# Patient Record
Sex: Female | Born: 1970 | Hispanic: Yes | Marital: Married | Smoking: Never smoker
Health system: Southern US, Community
[De-identification: ages and names within clinical notes are randomized; demographics above are authoritative.]

## PROBLEM LIST (undated history)

## (undated) DIAGNOSIS — Z862 Personal history of diseases of the blood and blood-forming organs and certain disorders involving the immune mechanism: Secondary | ICD-10-CM

## (undated) HISTORY — PX: ABDOMINAL HYSTERECTOMY: SHX81

## (undated) HISTORY — DX: Personal history of diseases of the blood and blood-forming organs and certain disorders involving the immune mechanism: Z86.2

## (undated) HISTORY — PX: TUBAL LIGATION: SHX77

---

## 2016-09-26 ENCOUNTER — Ambulatory Visit (INDEPENDENT_AMBULATORY_CARE_PROVIDER_SITE_OTHER): Payer: Self-pay | Admitting: Emergency Medicine

## 2016-09-26 ENCOUNTER — Encounter: Payer: Self-pay | Admitting: Emergency Medicine

## 2016-09-26 VITALS — BP 121/78 | HR 74 | Temp 98.3°F | Resp 16 | Ht 63.0 in | Wt 129.6 lb

## 2016-09-26 DIAGNOSIS — G44229 Chronic tension-type headache, not intractable: Secondary | ICD-10-CM

## 2016-09-26 DIAGNOSIS — R1013 Epigastric pain: Secondary | ICD-10-CM

## 2016-09-26 DIAGNOSIS — R42 Dizziness and giddiness: Secondary | ICD-10-CM

## 2016-09-26 NOTE — Progress Notes (Signed)
Alexandra Barnes 46 y.o.   Chief Complaint  Patient presents with  . Dizziness    MONTHS  . Headache    HISTORY OF PRESENT ILLNESS: This is a 46 y.o. female complaining of chronic dizziness and headaches x years; last medical evaluation many years ago. HPI   Prior to Admission medications   Not on File    Allergies not on file  There are no active problems to display for this patient.   No past medical history on file.  No past surgical history on file.  Social History   Social History  . Marital status: Married    Spouse name: N/A  . Number of children: N/A  . Years of education: N/A   Occupational History  . Not on file.   Social History Main Topics  . Smoking status: Never Smoker  . Smokeless tobacco: Never Used  . Alcohol use No  . Drug use: No  . Sexual activity: Not on file   Other Topics Concern  . Not on file   Social History Narrative  . No narrative on file    No family history on file.   Review of Systems  Constitutional: Negative for chills, fever, malaise/fatigue and weight loss.       Hair loss  HENT: Negative.   Eyes: Negative.   Respiratory: Negative for cough and shortness of breath.   Cardiovascular: Negative for chest pain, palpitations and leg swelling.  Gastrointestinal: Negative for abdominal pain, diarrhea, nausea and vomiting.       Epigastric fullness, early satiety.  Genitourinary: Negative for dysuria and hematuria.       Loss of libido.  Musculoskeletal: Negative for joint pain.  Skin: Negative.  Negative for rash.  Neurological: Positive for dizziness and headaches.  Endo/Heme/Allergies: Negative.   Psychiatric/Behavioral: Negative for depression, substance abuse and suicidal ideas. The patient is not nervous/anxious and does not have insomnia.   All other systems reviewed and are negative.  Vitals:   09/26/16 0854  BP: 121/78  Pulse: 74  Resp: 16  Temp: 98.3 F (36.8 C)     Physical Exam    Constitutional: She is oriented to person, place, and time. She appears well-developed and well-nourished.  HENT:  Head: Normocephalic and atraumatic.  Right Ear: External ear normal.  Left Ear: External ear normal.  Nose: Nose normal.  Mouth/Throat: Oropharynx is clear and moist.  Eyes: Conjunctivae and EOM are normal. Pupils are equal, round, and reactive to light.  Neck: Normal range of motion. Neck supple. No JVD present.  Cardiovascular: Normal rate, regular rhythm, normal heart sounds and intact distal pulses.   Pulmonary/Chest: Effort normal and breath sounds normal.  Abdominal: Soft. Bowel sounds are normal. She exhibits no distension. There is no tenderness.  Musculoskeletal: Normal range of motion.  Lymphadenopathy:    She has no cervical adenopathy.  Neurological: She is alert and oriented to person, place, and time. No sensory deficit. She exhibits normal muscle tone. Coordination normal.  Skin: Skin is warm and dry. Capillary refill takes less than 2 seconds. No rash noted.  Psychiatric: She has a normal mood and affect. Her behavior is normal.  Vitals reviewed.    ASSESSMENT & PLAN: Dizziness Chronic and almost always orthostatic.  Chronic tension-type headache, not intractable Chronic and non-debilitating.  Alexandra Barnes was seen today for dizziness and headache.  Diagnoses and all orders for this visit:  Dizziness -     Ambulatory referral to Gynecology -     CBC  with Differential/Platelet -     Comprehensive metabolic panel -     Lipid panel -     Hemoglobin A1c -     ANA,IFA RA Diag Pnl w/rflx Tit/Patn -     TSH  Chronic tension-type headache, not intractable  Epigastric discomfort -     Ambulatory referral to Gastroenterology    Patient Instructions       IF you received an x-ray today, you will receive an invoice from [facility]. Please contact [facility] at [phone] with questions or concerns regarding your invoice.   IF  you received labwork today, you will receive an invoice from [facility]. Please contact [facility] at [phone] with questions or concerns regarding your invoice.   Our billing staff will not be able to assist you with questions regarding bills from these companies.  You will be contacted with the lab results as soon as they are available. The fastest way to get your results is to activate your My Chart account. Instructions are located on the last page of this paperwork. If you have not heard from us regarding the results in 2 weeks, please contact this office.    We recommend that you schedule a mammogram for breast cancer screening. Typically, you do not need a referral to do this. Please contact a local imaging center to schedule your mammogram.  [facility] - [phone]  *ask for the Radiology Department [facility] - [phone] or [phone]  [facility] - [phone] [facility] - [phone] [facility] - [phone]  *ask for the Radiology Department [facility] - [phone]  *ask for the Radiology Department [facility] - [phone]) [phone]  *ask for the Mammography Department [facility] - [phone] Mareos (Dizziness) Los mareos son un problema muy frecuente. Causan sensacin de inestabilidad o de desvanecimiento. Puede sentir que se va a desmayar. Un mareo puede provocarle una lesin si se tropieza o se cae. Cualquier persona puede marearse, pero los [place]mareos son ms frecuentes en los ONEOKadultos mayores. Esta afeccin puede tener muchas causas, por ejemplo:  Medicamentos.  Deshidratacin.  Enfermedad. CUIDADOS EN EL HOGAR Estas indicaciones pueden ayudarlo con el trastorno: Comida y bebida  Beba suficiente lquido para Pharmacologistmantener el pis (orina) claro o de color amarillo plido. Esto evita la deshidratacin. Trate de beber ms lquidos transparentes, como  agua.  No beba alcohol.  Limite la cantidad de cafena que bebe o come si el mdico se lo indic.  Limite la cantidad de sal que bebe o come si el mdico se lo indic. Actividad  Evite los movimientos rpidos. ? Cuando se levante de una silla, sujtese hasta sentirse bien. ? Por la maana, sintese primero a un lado de la cama. Cuando se sienta bien, pngase lentamente de [address] mientras se sostiene de algo, hasta que sepa que ha logrado el equilibrio.  Mueva las piernas con frecuencia si debe estar de pie en un lugar durante mucho tiempo. Mientras est de pie, contraiga y relaje los msculos de las piernas.  No conduzca vehculos ni utilice maquinarias pesadas si se siente mareado.  Evite agacharse si se siente mareado. En su casa, coloque los objetos de modo que le resulte fcil alcanzarlos sin Public librarianagacharse. Estilo de vida  No consuma ningn producto que contenga tabaco, lo que incluye cigarrillos, tabaco de Theatre managermascar o Administrator, Civil Servicecigarrillos electrnicos. Si necesita ayuda para dejar de fumar, consulte al American Expressmdico.  Trate de reducir el  nivel de estrs practicando actividades como el yoga o la meditacin. Hable con el mdico si necesita ayuda. Instrucciones generales  Controle sus mareos para ver si hay cambios.  Tome los medicamentos solamente como se lo haya indicado el mdico. Hable con el mdico si cree que algn medicamento que est tomando es la causa de sus [place].  Infrmele a un amigo o a un familiar si se siente mareado. Pdale a esta persona que llame al mdico si observa cambios en su comportamiento.  Concurra a todas las visitas de control como se lo haya indicado el mdico. Esto es importante. SOLICITE AYUDA SI:  Los [employer] Express.  Los [employer] o la sensacin de Production assistant, radio.  Siente malestar estomacal (nuseas).  Tiene problemas para escuchar.  Aparecen nuevos sntomas.  Cuando est de pie se siente inestable o que la habitacin da vueltas.  SOLICITE AYUDA DE  INMEDIATO SI:  Vomita o tiene diarrea y no puede comer ni beber nada.  Tiene dificultad para lo siguiente: ? Hablar. ? Caminar. ? Tragar. ? Usar los brazos, las manos o las piernas.  Siente una debilidad generalizada.  No piensa con claridad o tiene dificultades para armar oraciones. Es posible que un amigo o un familiar adviertan que esto ocurre.  Tiene los siguientes sntomas: ? Journalist, newspaper. ? Dolor en el vientre (abdomen). ? Falta de aire. ? Sudoracin.  Cambios en la visin.  Hemorragias.  Dolores de [place].  Dolor o rigidez en el cuello.  Grant Ruts.  Esta informacin no tiene Theme park manager el consejo del mdico. Asegrese de hacerle al mdico cualquier pregunta que tenga. Document Released: 03/10/2011 Document Revised: 08/05/2014 Document Reviewed: 03/17/2014 Elsevier Interactive Patient Education  2017 Elsevier Inc.      Edwina Barth, MD [facility]

## 2016-09-26 NOTE — Assessment & Plan Note (Signed)
Chronic and almost always orthostatic.

## 2016-09-26 NOTE — Assessment & Plan Note (Signed)
Chronic and non-debilitating.

## 2016-09-26 NOTE — Patient Instructions (Addendum)
IF you received an x-ray today, you will receive an invoice from [facility]. Please contact [facility] at [phone] with questions or concerns regarding your invoice.   IF you received labwork today, you will receive an invoice from [facility]. Please contact [facility] at [phone] with questions or concerns regarding your invoice.   Our billing staff will not be able to assist you with questions regarding bills from these companies.  You will be contacted with the lab results as soon as they are available. The fastest way to get your results is to activate your My Chart account. Instructions are located on the last page of this paperwork. If you have not heard from [place] regarding the results in 2 weeks, please contact this office.    We recommend that you schedule a mammogram for breast cancer screening. Typically, you do not need a referral to do this. Please contact a local imaging center to schedule your mammogram.  [facility] - [phone]  *ask for the Radiology Department [facility]) - [phone] or [phone]  [facility] - [phone] [facility] - [phone] [facility]  - [phone]  *ask for the Radiology Department [facility] - [phone]  *ask for the Radiology Department [facility] - [phone]  *ask for the Mammography Department [facility] - [phone] Mareos (Dizziness) Los mareos son un problema muy frecuente. Causan sensacin de inestabilidad o de desvanecimiento. Puede sentir que se va a desmayar. Un mareo puede provocarle una lesin si se tropieza o se cae. Cualquier persona puede marearse, pero los [place] son ms frecuentes en los ONEOK. Esta afeccin puede tener muchas causas, por ejemplo:  Medicamentos.  Deshidratacin.  Enfermedad. CUIDADOS EN EL HOGAR Estas indicaciones pueden ayudarlo con  el trastorno: Comida y bebida  Beba suficiente lquido para Pharmacologist el pis (orina) claro o de color amarillo plido. Esto evita la deshidratacin. Trate de beber ms lquidos transparentes, como agua.  No beba alcohol.  Limite la cantidad de cafena que bebe o come si el mdico se lo indic.  Limite la cantidad de sal que bebe o come si el mdico se lo indic. Actividad  Evite los movimientos rpidos. ? Cuando se levante de una silla, sujtese hasta sentirse bien. ? Por la maana, sintese primero a un lado de la cama. Cuando se sienta bien, pngase lentamente de [address] se sostiene de algo, hasta que sepa que ha logrado el equilibrio.  Mueva las piernas con frecuencia si debe estar de pie en un lugar durante mucho tiempo. Mientras est de pie, contraiga y relaje los msculos de las piernas.  No conduzca vehculos ni utilice maquinarias pesadas si se siente mareado.  Evite agacharse si se siente mareado. En su casa, coloque los objetos de modo que le resulte fcil alcanzarlos sin Public librarian. Estilo de vida  No consuma ningn producto que contenga tabaco, lo que incluye cigarrillos, tabaco de Theatre manager o Administrator, Civil Service. Si necesita ayuda para dejar de fumar, consulte al [employer] Express.  Trate de reducir el nivel de estrs practicando actividades como el yoga o la meditacin. Hable con el mdico si necesita ayuda. Instrucciones generales  Controle sus mareos para ver si hay cambios.  Tome los medicamentos solamente como se lo haya indicado el mdico. Hable con el mdico si cree que algn medicamento que est tomando es la causa de sus [place].  Infrmele a un amigo o a Media planner  si se siente mareado. Pdale a esta persona que llame al mdico si observa cambios en su comportamiento.  Concurra a todas las visitas de control como se lo haya indicado el mdico. Esto es importante. SOLICITE AYUDA SI:  Los American Expressmareos persisten.  Los Golden West Financialmareos o la sensacin de Scientist, research (physical sciences)desvanecimiento  empeoran.  Siente malestar estomacal (nuseas).  Tiene problemas para escuchar.  Aparecen nuevos sntomas.  Cuando est de pie se siente inestable o que la habitacin da vueltas.  SOLICITE AYUDA DE INMEDIATO SI:  Vomita o tiene diarrea y no puede comer ni beber nada.  Tiene dificultad para lo siguiente: ? Hablar. ? Caminar. ? Tragar. ? Usar los brazos, las manos o las piernas.  Siente una debilidad generalizada.  No piensa con claridad o tiene dificultades para armar oraciones. Es posible que un amigo o un familiar adviertan que esto ocurre.  Tiene los siguientes sntomas: ? Journalist, newspaperDolor en el pecho. ? Dolor en el vientre (abdomen). ? Falta de aire. ? Sudoracin.  Cambios en la visin.  Hemorragias.  Dolores de Turkmenistancabeza.  Dolor o rigidez en el cuello.  Grant RutsFiebre.  Esta informacin no tiene Theme park managercomo fin reemplazar el consejo del mdico. Asegrese de hacerle al mdico cualquier pregunta que tenga. Document Released: 03/10/2011 Document Revised: 08/05/2014 Document Reviewed: 03/17/2014 Elsevier Interactive Patient Education  2017 [employer].

## 2016-09-28 ENCOUNTER — Encounter: Payer: Self-pay | Admitting: *Deleted

## 2016-09-28 LAB — CBC WITH DIFFERENTIAL/PLATELET
Basophils Absolute: 0 10*3/uL (ref 0.0–0.2)
Basos: 0 %
EOS (ABSOLUTE): 1 10*3/uL — AB (ref 0.0–0.4)
EOS: 11 %
HEMATOCRIT: 40 % (ref 34.0–46.6)
Hemoglobin: 12.9 g/dL (ref 11.1–15.9)
IMMATURE GRANULOCYTES: 1 %
Immature Grans (Abs): 0 10*3/uL (ref 0.0–0.1)
Lymphocytes Absolute: 2.1 10*3/uL (ref 0.7–3.1)
Lymphs: 24 %
MCH: 27.4 pg (ref 26.6–33.0)
MCHC: 32.3 g/dL (ref 31.5–35.7)
MCV: 85 fL (ref 79–97)
MONOS ABS: 0.4 10*3/uL (ref 0.1–0.9)
Monocytes: 4 %
NEUTROS PCT: 60 %
Neutrophils Absolute: 5.3 10*3/uL (ref 1.4–7.0)
Platelets: 261 10*3/uL (ref 150–379)
RBC: 4.7 x10E6/uL (ref 3.77–5.28)
RDW: 15.1 % (ref 12.3–15.4)
WBC: 8.8 10*3/uL (ref 3.4–10.8)

## 2016-09-28 LAB — HEMOGLOBIN A1C
Est. average glucose Bld gHb Est-mCnc: 114 mg/dL
Hgb A1c MFr Bld: 5.6 % (ref 4.8–5.6)

## 2016-09-28 LAB — LIPID PANEL
CHOL/HDL RATIO: 3.1 ratio (ref 0.0–4.4)
Cholesterol, Total: 183 mg/dL (ref 100–199)
HDL: 59 mg/dL (ref >39)
LDL CALC: 110 mg/dL — AB (ref 0–99)
TRIGLYCERIDES: 71 mg/dL (ref 0–149)
VLDL Cholesterol Cal: 14 mg/dL (ref 5–40)

## 2016-09-28 LAB — COMPREHENSIVE METABOLIC PANEL
ALT: 11 IU/L (ref 0–32)
AST: 13 IU/L (ref 0–40)
Albumin/Globulin Ratio: 1.6 (ref 1.2–2.2)
Albumin: 4.5 g/dL (ref 3.5–5.5)
Alkaline Phosphatase: 80 IU/L (ref 39–117)
BUN/Creatinine Ratio: 17 (ref 9–23)
BUN: 11 mg/dL (ref 6–24)
Bilirubin Total: 0.3 mg/dL (ref 0.0–1.2)
CALCIUM: 9.5 mg/dL (ref 8.7–10.2)
CO2: 20 mmol/L (ref 20–29)
Chloride: 103 mmol/L (ref 96–106)
Creatinine, Ser: 0.64 mg/dL (ref 0.57–1.00)
GFR calc Af Amer: 125 mL/min/{1.73_m2} (ref >59)
GFR, EST NON AFRICAN AMERICAN: 108 mL/min/{1.73_m2} (ref >59)
GLUCOSE: 90 mg/dL (ref 65–99)
Globulin, Total: 2.9 g/dL (ref 1.5–4.5)
Potassium: 4.2 mmol/L (ref 3.5–5.2)
Sodium: 140 mmol/L (ref 134–144)
Total Protein: 7.4 g/dL (ref 6.0–8.5)

## 2016-09-28 LAB — ANA,IFA RA DIAG PNL W/RFLX TIT/PATN
ANA Titer 1: NEGATIVE
Cyclic Citrullin Peptide Ab: 5 units (ref 0–19)
Rhuematoid fact SerPl-aCnc: <10 IU/mL (ref 0.0–13.9)

## 2016-09-28 LAB — TSH: TSH: 2.73 u[IU]/mL (ref 0.450–4.500)

## 2017-01-03 ENCOUNTER — Emergency Department (HOSPITAL_COMMUNITY)
Admission: EM | Admit: 2017-01-03 | Discharge: 2017-01-03 | Disposition: A | Discharge status: Home / Self Care | Payer: 59 | Attending: Emergency Medicine | Admitting: Emergency Medicine

## 2017-01-03 ENCOUNTER — Encounter: Payer: Self-pay | Admitting: Emergency Medicine

## 2017-01-03 DIAGNOSIS — N760 Acute vaginitis: Secondary | ICD-10-CM | POA: Diagnosis not present

## 2017-01-03 DIAGNOSIS — R509 Fever, unspecified: Secondary | ICD-10-CM | POA: Diagnosis present

## 2017-01-03 DIAGNOSIS — N941 Unspecified dyspareunia: Secondary | ICD-10-CM | POA: Diagnosis not present

## 2017-01-03 DIAGNOSIS — N10 Acute tubulo-interstitial nephritis: Secondary | ICD-10-CM | POA: Insufficient documentation

## 2017-01-03 DIAGNOSIS — R1031 Right lower quadrant pain: Secondary | ICD-10-CM | POA: Insufficient documentation

## 2017-01-03 DIAGNOSIS — R300 Dysuria: Secondary | ICD-10-CM | POA: Insufficient documentation

## 2017-01-03 DIAGNOSIS — B9689 Other specified bacterial agents as the cause of diseases classified elsewhere: Secondary | ICD-10-CM | POA: Insufficient documentation

## 2017-01-03 DIAGNOSIS — N12 Tubulo-interstitial nephritis, not specified as acute or chronic: Secondary | ICD-10-CM

## 2017-01-03 LAB — URINALYSIS, ROUTINE W REFLEX MICROSCOPIC
Bilirubin Urine: NEGATIVE
GLUCOSE, UA: NEGATIVE mg/dL
KETONES UR: NEGATIVE mg/dL
Nitrite: NEGATIVE
PROTEIN: 30 mg/dL — AB
Specific Gravity, Urine: 1.016 (ref 1.005–1.030)
pH: 5 (ref 5.0–8.0)

## 2017-01-03 LAB — WET PREP, GENITAL
Sperm: NONE SEEN
Trich, Wet Prep: NONE SEEN
Yeast Wet Prep HPF POC: NONE SEEN

## 2017-01-03 MED ORDER — CIPROFLOXACIN HCL 500 MG PO TABS: 500.0000 mg | ORAL_TABLET | Freq: Two times a day (BID) | ORAL | 0 refills | Status: AC

## 2017-01-03 MED ORDER — METRONIDAZOLE 500 MG PO TABS: 500.0000 mg | ORAL_TABLET | Freq: Two times a day (BID) | ORAL | 0 refills | Status: DC

## 2017-01-03 NOTE — ED Notes (Signed)
Pt complains of cold sx for one week, she complains of a fever and chills Pt had dental work about two weeks ago and received amoxicillian and ibuprofen

## 2017-01-03 NOTE — Discharge Instructions (Signed)
Your urine was infected today, and you have signs and symptoms of a kidney infection.  You also have a vaginal infection which is called bacterial vaginosis.  I have prescribed you two antibiotics, please take each antibiotic twice a day for the next 7 days.  You may take Advil 800 mg every 6 hours as needed for fever.  Please return to the emergency department  if you continue to have pain with urination after being on the antibiotic for 3 days.

## 2017-01-03 NOTE — ED Provider Notes (Signed)
[facility]-EMERGENCY DEPT Provider Note   CSN: [id number] Arrival date & time: 01/03/17  1918     History   Chief Complaint No chief complaint on file.   HPI Alexandra Barnes is a 46 y.o. female.  HPI   Alexandra Barnes is a 46 year old female with a history of headaches who presents the emergency department for evaluation of fever and chills for the past 2 weeks. She is Spanish-speaking and [place] interpreter is used during the encounter. Patient states that she has had a tactile fever with chills daily. She states that Ibuprofen relieves her symptoms but only temporarily. She also is complaining of dysuria, urinary frequency and urgency which has occurred for the past 2 days. She reports that she has a right sided "kidney pain" and points to her right back. She also endorses dyspareunia and is asking to be checked for vaginal infection. She denies vaginal discharge, hematuria, nausea/vomiting, diarrhea, chest pain, shortness of breath.  Past Medical History:  Diagnosis Date  . Anemia     Patient Active Problem List   Diagnosis Date Noted  . Dizziness 09/26/2016  . Chronic tension-type headache, not intractable 09/26/2016    No past surgical history on file.  OB History    No data available       Home Medications    Prior to Admission medications   Medication Sig Start Date End Date Taking? Authorizing Provider  ciprofloxacin (CIPRO) 500 MG tablet Take 1 tablet (500 mg total) by mouth every 12 (twelve) hours. 01/03/17 01/10/17  Kellie Shropshire, PA-C  metroNIDAZOLE (FLAGYL) 500 MG tablet Take 1 tablet (500 mg total) by mouth 2 (two) times daily. 01/03/17   Kellie Shropshire, PA-C    Family History No family history on file.  Social History Social History  Substance Use Topics  . Smoking status: Never Smoker  . Smokeless tobacco: Never Used  . Alcohol use No     Allergies   Patient has no allergy information on record.   Review of Systems Review of Systems    Constitutional: Positive for chills and fever. Negative for fatigue and unexpected weight change.  HENT: Negative for congestion, ear discharge, ear pain, hearing loss, rhinorrhea, sinus pressure and sore throat.   Eyes: Negative for visual disturbance.  Respiratory: Negative for cough and shortness of breath.   Cardiovascular: Negative for chest pain and palpitations.  Gastrointestinal: Negative for abdominal pain, diarrhea, nausea and vomiting.  Genitourinary: Positive for dyspareunia, dysuria, flank pain and frequency. Negative for difficulty urinating, hematuria and pelvic pain.  Musculoskeletal: Negative for arthralgias.  Skin: Negative for rash and wound.  Neurological: Negative for weakness, numbness and headaches.  Psychiatric/Behavioral: Negative for agitation.     Physical Exam Updated Vital Signs BP 111/68   Pulse 98   Temp 98.5 F (36.9 C)   Resp 16   LMP  (LMP Unknown)   SpO2 99%   Physical Exam  Constitutional: She is oriented to person, place, and time. She appears well-developed and well-nourished. No distress.  HENT:  Head: Normocephalic and atraumatic.  Mouth/Throat: Oropharynx is clear and moist. No oropharyngeal exudate.  Eyes: Pupils are equal, round, and reactive to light. Conjunctivae are normal. Right eye exhibits no discharge. Left eye exhibits no discharge.  Neck: Normal range of motion. Neck supple.  Cardiovascular: Regular rhythm and intact distal pulses.  Exam reveals no friction rub.   No murmur heard. Tachycardic.  Pulmonary/Chest: Effort normal and breath sounds normal. No respiratory distress. She has no wheezes.  She has no rales.  Abdominal: Soft. Bowel sounds are normal. There is no tenderness. There is no guarding.  CVA tenderness present on the right side.  Genitourinary:  Genitourinary Comments: Chaperone present for exam. Grey discharge noted in the vaginal vault. No CMT. No adnexal masses, tenderness, or fullness.  No bleeding within  vaginal vault.  Neurological: She is alert and oriented to person, place, and time. Coordination normal.  Skin: She is not diaphoretic.  Psychiatric: She has a normal mood and affect. Her behavior is normal.  Nursing note and vitals reviewed.    ED Treatments / Results  Labs (all labs ordered are listed, but only abnormal results are displayed) Labs Reviewed  WET PREP, GENITAL - Abnormal; Notable for the following:       Result Value   Clue Cells Wet Prep HPF POC PRESENT (*)    WBC, Wet Prep HPF POC FEW (*)    All other components within normal limits  URINALYSIS, ROUTINE W REFLEX MICROSCOPIC - Abnormal; Notable for the following:    APPearance HAZY (*)    Hgb urine dipstick MODERATE (*)    Protein, ur 30 (*)    Leukocytes, UA MODERATE (*)    Bacteria, UA MANY (*)    Squamous Epithelial / LPF 6-30 (*)    All other components within normal limits  URINE CULTURE  GC/CHLAMYDIA PROBE AMP ([place]) NOT AT [facility]    EKG  EKG Interpretation None       Radiology No results found.  Procedures Procedures (including critical care time)  Medications Ordered in ED Medications - No data to display   Initial Impression / Assessment and Plan / ED Course  I have reviewed the triage vital signs and the nursing notes.  Pertinent labs & imaging results that were available during my care of the patient were reviewed by me and considered in my medical decision making (see chart for details).     UA is infected with many white blood cells and bacteria. Given the patient has a fever and right-sided CVA tenderness, treated for pyelonephritis with ciprofloxacin. Patient also has clue cells on wet prep, treated for BV with metronidazole. Patient counseled to continue to use ibuprofen for fever. Have counseled patient on return precautions including dysuria and upper right back pain that continues despite antibiotic treatment. Patient voices understanding and agrees to plan.   Final  Clinical Impressions(s) / ED Diagnoses   Final diagnoses:  Pyelonephritis  Bacterial vaginosis    New Prescriptions Discharge Medication List as of 01/03/2017 10:33 PM    START taking these medications   Details  ciprofloxacin (CIPRO) 500 MG tablet Take 1 tablet (500 mg total) by mouth every 12 (twelve) hours., Starting Tue 01/03/2017, Until Tue 01/10/2017, Print    metroNIDAZOLE (FLAGYL) 500 MG tablet Take 1 tablet (500 mg total) by mouth 2 (two) times daily., Starting Tue 01/03/2017, Print         Kellie Shropshire, PA-C 01/04/17 1610    Gwyneth Sprout, MD 01/04/17 2102

## 2017-01-04 LAB — GC/CHLAMYDIA PROBE AMP (~~LOC~~) NOT AT ARMC
Chlamydia: NEGATIVE
Neisseria Gonorrhea: NEGATIVE

## 2017-01-06 ENCOUNTER — Encounter: Payer: Self-pay | Admitting: Emergency Medicine

## 2017-01-06 ENCOUNTER — Ambulatory Visit (INDEPENDENT_AMBULATORY_CARE_PROVIDER_SITE_OTHER): Payer: 59 | Admitting: Emergency Medicine

## 2017-01-06 VITALS — BP 118/60 | HR 98 | Temp 98.3°F | Resp 16 | Ht 63.0 in | Wt 131.6 lb

## 2017-01-06 DIAGNOSIS — N760 Acute vaginitis: Secondary | ICD-10-CM

## 2017-01-06 DIAGNOSIS — Z09 Encounter for follow-up examination after completed treatment for conditions other than malignant neoplasm: Secondary | ICD-10-CM | POA: Insufficient documentation

## 2017-01-06 DIAGNOSIS — B9689 Other specified bacterial agents as the cause of diseases classified elsewhere: Secondary | ICD-10-CM | POA: Diagnosis not present

## 2017-01-06 DIAGNOSIS — Z87440 Personal history of urinary (tract) infections: Secondary | ICD-10-CM | POA: Diagnosis not present

## 2017-01-06 DIAGNOSIS — R399 Unspecified symptoms and signs involving the genitourinary system: Secondary | ICD-10-CM

## 2017-01-06 LAB — POCT URINALYSIS DIP (MANUAL ENTRY)
Bilirubin, UA: NEGATIVE
Glucose, UA: NEGATIVE mg/dL
Ketones, POC UA: NEGATIVE mg/dL
NITRITE UA: NEGATIVE
PH UA: 6.5 (ref 5.0–8.0)
Protein Ur, POC: NEGATIVE mg/dL
SPEC GRAV UA: 1.025 (ref 1.010–1.025)
UROBILINOGEN UA: 0.2 U/dL

## 2017-01-06 LAB — URINE CULTURE: Culture: >=100000 — AB

## 2017-01-06 NOTE — Patient Instructions (Addendum)
IF you received an x-ray today, you will receive an invoice from [facility]. Please contact [facility] at [phone] with questions or concerns regarding your invoice.   IF you received labwork today, you will receive an invoice from [facility]. Please contact [facility] at [phone] with questions or concerns regarding your invoice.   Our billing staff will not be able to assist you with questions regarding bills from these companies.  You will be contacted with the lab results as soon as they are available. The fastest way to get your results is to activate your My Chart account. Instructions are located on the last page of this paperwork. If you have not heard from [place] regarding the results in 2 weeks, please contact this office.     Pielonefritis en los adultos (Pyelonephritis, Adult) La pielonefritis es una infeccin del rin. Los riones son los rganos que ayudan a limpiar la sangre al [employer] residuos a travs de la [place]. Esta infeccin puede curarse rpidamente o durar [employer]. En la [employer], desaparece con el tratamiento y no causa otros problemas. CUIDADOS EN EL HOGAR Medicamentos  [employer] de venta libre y los recetados solamente como se lo haya indicado el mdico.  Tome el antibitico como se lo indic su mdico. No deje de tomar los medicamentos aunque comience a Actor. Instrucciones generales  Beba suficiente lquido para mantener el pis claro o de color amarillo plido.  Evite la cafena, el t y las [address].  Orine con frecuencia. Evite retener la orina durante largos perodos.  Orine antes y despus de las [employer].  Despus de defecar, las mujeres deben higienizarse desde adelante hacia atrs. Use cada trozo de papel higinico solo una vez.  Concurra a todas las visitas de control como se lo haya indicado el mdico. Esto es importante. SOLICITE AYUDA SI:  No mejora luego  de 2 das.  Los sntomas empeoran.  Tiene fiebre. SOLICITE AYUDA DE INMEDIATO SI:  No puede tomar los medicamentos o beber lquidos segn las indicaciones.  Siente escalofros o comienza a Secretary/administrator.  Vomita.  Tiene un dolor muy intenso en el costado (fosa lumbar) o en la espalda.  Se siente muy dbil o se desvanece (se desmaya). Esta informacin no tiene Theme park manager el consejo del mdico. Asegrese de hacerle al mdico cualquier pregunta que tenga. Document Released: 03/21/2005 Document Revised: 12/10/2014 Document Reviewed: 07/14/2014 Elsevier Interactive Patient Education  2018 [employer].  Vaginosis bacteriana (Bacterial Vaginosis) La vaginosis bacteriana es una infeccin de la vagina. Se produce cuando crece una cantidad excesiva de grmenes normales (bacterias sanas) en la vagina. Esta infeccin aumenta el riesgo de contraer otras infecciones de transmisin sexual. El tratamiento de esta infeccin puede ayudar a reducir el riesgo de otras infecciones, como:  Clamidia.  Bettey Mare.  VIH.  Herpes. CUIDADOS EN EL HOGAR  Tome los medicamentos tal como se lo indic su mdico.  Finalice la prescripcin completa, aunque comience a sentirse mejor.  Comunique a sus compaeros sexuales que sufre una infeccin. Deben consultar a su mdico para iniciar un tratamiento.  Durante el tratamiento: ? Soil scientist o use preservativos de Network engineer. ? No se haga duchas vaginales. ? No consuma alcohol a menos que el mdico lo autorice. ? No amamante a menos que el mdico la autorice.  SOLICITE AYUDA SI:  No mejora luego de 3 das de [place].  Observa una secrecin (prdida) de color gris ms  abundante que proviene de la vagina.  Siente ms dolor que antes.  Tiene fiebre.  ASEGRESE DE QUE:  Comprende estas instrucciones.  Controlar su afeccin.  Recibir ayuda de inmediato si no mejora o si empeora.  Esta informacin no tiene  Theme park manager el consejo del mdico. Asegrese de hacerle al mdico cualquier pregunta que tenga. Document Released: 06/17/2008 Document Revised: 07/13/2015 Document Reviewed: 10/31/2012 Elsevier Interactive Patient Education  2017 [employer].

## 2017-01-06 NOTE — Progress Notes (Signed)
Alexandra Barnes 46 y.o.   Chief Complaint  Patient presents with  . Follow-up    hospital ED visit on 01/03/17    HISTORY OF PRESENT ILLNESS: This is a 46 y.o. female recently in ED treated for UTI (pyelonephritis) and BV; presently on Cipro and Flagyl. Feeling better; no longer febrile; eating well; tolerating antibiotics well. Improving.  HPI   Prior to Admission medications   Medication Sig Start Date End Date Taking? Authorizing Provider  ciprofloxacin (CIPRO) 500 MG tablet Take 1 tablet (500 mg total) by mouth every 12 (twelve) hours. 01/03/17 01/10/17 Yes Kellie Shropshire, PA-C  metroNIDAZOLE (FLAGYL) 500 MG tablet Take 1 tablet (500 mg total) by mouth 2 (two) times daily. 01/03/17  Yes Kellie Shropshire, PA-C    No Known Allergies  Patient Active Problem List   Diagnosis Date Noted  . Dizziness 09/26/2016  . Chronic tension-type headache, not intractable 09/26/2016    Past Medical History:  Diagnosis Date  . Anemia     No past surgical history on file.  Social History   Social History  . Marital status: Married    Spouse name: N/A  . Number of children: N/A  . Years of education: N/A   Occupational History  . Not on file.   Social History Main Topics  . Smoking status: Never Smoker  . Smokeless tobacco: Never Used  . Alcohol use No  . Drug use: No  . Sexual activity: Not on file   Other Topics Concern  . Not on file   Social History Narrative  . No narrative on file    No family history on file.   Review of Systems  Constitutional: Negative.  Negative for chills and fever.  HENT: Negative.   Eyes: Negative.   Respiratory: Negative for cough and shortness of breath.   Cardiovascular: Negative for chest pain.  Gastrointestinal: Negative.  Negative for abdominal pain, diarrhea, nausea and vomiting.  Genitourinary: Negative.  Negative for dysuria, flank pain, frequency, hematuria and urgency.  Musculoskeletal: Negative.  Negative for back  pain, joint pain and myalgias.  Skin: Negative.  Negative for rash.  Neurological: Negative.   Endo/Heme/Allergies: Negative.   All other systems reviewed and are negative.  Vitals:   01/06/17 1514  BP: 118/60  Pulse: 98  Resp: 16  Temp: 98.3 F (36.8 C)  SpO2: 99%     Physical Exam  Constitutional: She is oriented to person, place, and time. She appears well-developed and well-nourished.  HENT:  Head: Normocephalic and atraumatic.  Mouth/Throat: Oropharynx is clear and moist.  Eyes: Pupils are equal, round, and reactive to light. Conjunctivae and EOM are normal.  Neck: Normal range of motion. Neck supple.  Cardiovascular: Normal rate, regular rhythm, normal heart sounds and intact distal pulses.   Pulmonary/Chest: Effort normal and breath sounds normal.  Abdominal: Soft. Bowel sounds are normal. She exhibits no distension. There is no tenderness.  Musculoskeletal: Normal range of motion.  Neurological: She is alert and oriented to person, place, and time. No sensory deficit. She exhibits normal muscle tone.  Skin: Skin is warm and dry. Capillary refill takes less than 2 seconds. No rash noted.  Psychiatric: She has a normal mood and affect. Her behavior is normal.  Vitals reviewed.  Results for orders placed or performed in visit on 01/06/17 (from the past 24 hour(s))  POCT urinalysis dipstick     Status: Abnormal   Collection Time: 01/06/17  4:08 PM  Result Value Ref Range  Color, UA yellow yellow   Clarity, UA clear clear   Glucose, UA negative negative mg/dL   Bilirubin, UA negative negative   Ketones, POC UA negative negative mg/dL   Spec Grav, UA 1.610 9.604 - 1.025   Blood, UA trace-lysed (A) negative   pH, UA 6.5 5.0 - 8.0   Protein Ur, POC negative negative mg/dL   Urobilinogen, UA 0.2 0.2 or 1.0 E.U./dL   Nitrite, UA Negative Negative   Leukocytes, UA Trace (A) Negative     ASSESSMENT & PLAN: Alexandra Barnes was seen today for follow-up.  Diagnoses and all  orders for this visit:  Lower urinary tract symptoms (LUTS) Comments: improved Orders: -     Urine Culture -     POCT urinalysis dipstick  History of UTI Comments: recent Orders: -     Urine Culture -     POCT urinalysis dipstick  Hospital discharge follow-up Comments: Dx: Pyelonephritis  Bacterial vaginosis Comments: improved  Other orders -     Tdap vaccine greater than or equal to 7yo IM  Continue and finish antibiotics.  Patient Instructions       IF you received an x-ray today, you will receive an invoice from [facility]. Please contact [facility] at [phone] with questions or concerns regarding your invoice.   IF you received labwork today, you will receive an invoice from [place]. Please contact [facility] at [phone] with questions or concerns regarding your invoice.   Our billing staff will not be able to assist you with questions regarding bills from these companies.  You will be contacted with the lab results as soon as they are available. The fastest way to get your results is to activate your My Chart account. Instructions are located on the last page of this paperwork. If you have not heard from [place] regarding the results in 2 weeks, please contact this office.     Pielonefritis en los adultos (Pyelonephritis, Adult) La pielonefritis es una infeccin del rin. Los riones son los rganos que ayudan a limpiar la sangre al [employer] residuos a travs de la [place]. Esta infeccin puede curarse rpidamente o durar [employer]. En la [employer], desaparece con el tratamiento y no causa otros problemas. CUIDADOS EN EL HOGAR Medicamentos  [employer] de venta libre y los recetados solamente como se lo haya indicado el mdico.  Tome el antibitico como se lo indic su mdico. No deje de tomar los medicamentos aunque comience a Actor. Instrucciones generales  Beba suficiente lquido para mantener el  pis claro o de color amarillo plido.  Evite la cafena, el t y las [address].  Orine con frecuencia. Evite retener la orina durante largos perodos.  Orine antes y despus de las [employer].  Despus de defecar, las mujeres deben higienizarse desde adelante hacia atrs. Use cada trozo de papel higinico solo una vez.  Concurra a todas las visitas de control como se lo haya indicado el mdico. Esto es importante. SOLICITE AYUDA SI:  No mejora luego de 2 das.  Los sntomas empeoran.  Tiene fiebre. SOLICITE AYUDA DE INMEDIATO SI:  No puede tomar los medicamentos o beber lquidos segn las indicaciones.  Siente escalofros o comienza a Secretary/administrator.  Vomita.  Tiene un dolor muy intenso en el costado (fosa lumbar) o en la espalda.  Se siente muy dbil o se desvanece (se desmaya). Esta informacin no tiene Theme park manager el consejo del mdico. Asegrese de hacerle al mdico cualquier  pregunta que tenga. Document Released: 03/21/2005 Document Revised: 12/10/2014 Document Reviewed: 07/14/2014 Elsevier Interactive Patient Education  2018 [employer].  Vaginosis bacteriana (Bacterial Vaginosis) La vaginosis bacteriana es una infeccin de la vagina. Se produce cuando crece una cantidad excesiva de grmenes normales (bacterias sanas) en la vagina. Esta infeccin aumenta el riesgo de contraer otras infecciones de transmisin sexual. El tratamiento de esta infeccin puede ayudar a reducir el riesgo de otras infecciones, como:  Clamidia.  Bettey Mare.  VIH.  Herpes. CUIDADOS EN EL HOGAR  Tome los medicamentos tal como se lo indic su mdico.  Finalice la prescripcin completa, aunque comience a sentirse mejor.  Comunique a sus compaeros sexuales que sufre una infeccin. Deben consultar a su mdico para iniciar un tratamiento.  Durante el tratamiento: ? Soil scientist o use preservativos de Network engineer. ? No se haga duchas  vaginales. ? No consuma alcohol a menos que el mdico lo autorice. ? No amamante a menos que el mdico la autorice.  SOLICITE AYUDA SI:  No mejora luego de 3 das de [place].  Observa una secrecin (prdida) de color gris ms abundante que proviene de la vagina.  Siente ms dolor que antes.  Tiene fiebre.  ASEGRESE DE QUE:  Comprende estas instrucciones.  Controlar su afeccin.  Recibir ayuda de inmediato si no mejora o si empeora.  Esta informacin no tiene Theme park manager el consejo del mdico. Asegrese de hacerle al mdico cualquier pregunta que tenga. Document Released: 06/17/2008 Document Revised: 07/13/2015 Document Reviewed: 10/31/2012 Elsevier Interactive Patient Education  2017 Elsevier Inc.      Edwina Barth, MD [facility]

## 2017-01-07 ENCOUNTER — Telehealth: Payer: Self-pay

## 2017-01-07 LAB — URINE CULTURE: Organism ID, Bacteria: NO GROWTH

## 2017-01-07 NOTE — Telephone Encounter (Signed)
Post ED Visit - Positive Culture Follow-up  Culture report reviewed by antimicrobial stewardship pharmacist:   Enzo Bi, Pharm.D.  Celedonio Miyamoto, Pharm.D., BCPS AQ-ID  Garvin Fila, Pharm.D., BCPS  Georgina Pillion, Pharm.D., BCPS  [place], [place].D., BCPS, AAHIVP  Estella Husk, Pharm.D., BCPS, AAHIVP  Lysle Pearl, PharmD, BCPS  Casilda Carls, PharmD, BCPS  Pollyann Samples, PharmD, BCPS Sharin Mons Pharm D Positive urine culture Treated with Ciprofloxacin, organism sensitive to the same and no further patient follow-up is required at this time.  Jerry Caras 01/07/2017, 9:48 AM

## 2017-01-09 ENCOUNTER — Telehealth: Payer: Self-pay | Admitting: Urgent Care

## 2017-01-09 NOTE — Telephone Encounter (Signed)
Patients husband called in wanting to talk to [facility] about his wife's condition. It was very hard to understand him due to language barrier. So I'm not sure what is wrong wit her.  His call back number is [phone]

## 2017-01-10 ENCOUNTER — Encounter: Payer: Self-pay | Admitting: Radiology

## 2017-01-11 NOTE — Telephone Encounter (Signed)
I have never seen this patient

## 2017-01-11 NOTE — Telephone Encounter (Signed)
Phone call attempted. VM not set up.

## 2017-01-13 ENCOUNTER — Telehealth: Payer: Self-pay | Admitting: Emergency Medicine

## 2017-01-13 NOTE — Telephone Encounter (Signed)
Spoke to Alexandra Barnes's husband who states patient is doing very well; finished antibiotic; asymptomatic; last urine culture negative.

## 2017-01-13 NOTE — Telephone Encounter (Signed)
Spoke to husband; patient doing well; finished medication; asymptomatic; urine culture negative; advised to follow up as needed.

## 2017-01-24 ENCOUNTER — Encounter (HOSPITAL_COMMUNITY): Payer: Self-pay | Admitting: Emergency Medicine

## 2017-01-24 DIAGNOSIS — Y69 Unspecified misadventure during surgical and medical care: Secondary | ICD-10-CM | POA: Insufficient documentation

## 2017-01-24 DIAGNOSIS — T887XXA Unspecified adverse effect of drug or medicament, initial encounter: Secondary | ICD-10-CM | POA: Diagnosis not present

## 2017-01-24 DIAGNOSIS — R112 Nausea with vomiting, unspecified: Secondary | ICD-10-CM | POA: Diagnosis present

## 2017-01-24 MED ORDER — ONDANSETRON 4 MG PO TBDP
4.0000 mg | ORAL_TABLET | Freq: Once | ORAL | Status: AC | PRN
  Administered 2017-01-24: 4 mg via ORAL
  Filled 2017-01-24: qty 1

## 2017-01-24 NOTE — ED Triage Notes (Signed)
Family states pt took a Microbiologistpercocet around [employer] tonight and around 645 started to complain that she did not feel well  States she felt bad and dizzy and shaky and then started having vomiting

## 2017-01-25 ENCOUNTER — Emergency Department (HOSPITAL_COMMUNITY)
Admission: EM | Admit: 2017-01-25 | Discharge: 2017-01-25 | Disposition: A | Discharge status: Home / Self Care | Payer: 59 | Attending: Emergency Medicine | Admitting: Emergency Medicine

## 2017-01-25 DIAGNOSIS — R112 Nausea with vomiting, unspecified: Secondary | ICD-10-CM

## 2017-01-25 DIAGNOSIS — T887XXA Unspecified adverse effect of drug or medicament, initial encounter: Secondary | ICD-10-CM

## 2017-01-25 LAB — URINALYSIS, ROUTINE W REFLEX MICROSCOPIC
Bilirubin Urine: NEGATIVE
GLUCOSE, UA: NEGATIVE mg/dL
Hgb urine dipstick: NEGATIVE
Ketones, ur: NEGATIVE mg/dL
LEUKOCYTES UA: NEGATIVE
Nitrite: NEGATIVE
PH: 7 (ref 5.0–8.0)
PROTEIN: NEGATIVE mg/dL
SPECIFIC GRAVITY, URINE: 1.009 (ref 1.005–1.030)

## 2017-01-25 LAB — CBC WITH DIFFERENTIAL/PLATELET
BASOS PCT: 0 %
Basophils Absolute: 0 10*3/uL (ref 0.0–0.1)
EOS PCT: 2 %
Eosinophils Absolute: 0.2 10*3/uL (ref 0.0–0.7)
HEMATOCRIT: 38.6 % (ref 36.0–46.0)
Hemoglobin: 12.7 g/dL (ref 12.0–15.0)
LYMPHS PCT: 15 %
Lymphs Abs: 1.6 10*3/uL (ref 0.7–4.0)
MCH: 27.4 pg (ref 26.0–34.0)
MCHC: 32.9 g/dL (ref 30.0–36.0)
MCV: 83.4 fL (ref 78.0–100.0)
MONO ABS: 0.4 10*3/uL (ref 0.1–1.0)
MONOS PCT: 4 %
NEUTROS ABS: 8.5 10*3/uL — AB (ref 1.7–7.7)
Neutrophils Relative %: 79 %
Platelets: 283 10*3/uL (ref 150–400)
RBC: 4.63 MIL/uL (ref 3.87–5.11)
RDW: 15.4 % (ref 11.5–15.5)
WBC: 10.8 10*3/uL — ABNORMAL HIGH (ref 4.0–10.5)

## 2017-01-25 LAB — BASIC METABOLIC PANEL
Anion gap: 10 (ref 5–15)
BUN: 9 mg/dL (ref 6–20)
CALCIUM: 9.1 mg/dL (ref 8.9–10.3)
CO2: 26 mmol/L (ref 22–32)
CREATININE: 0.61 mg/dL (ref 0.44–1.00)
Chloride: 101 mmol/L (ref 101–111)
GFR calc Af Amer: >60 mL/min (ref >60)
GFR calc non Af Amer: >60 mL/min (ref >60)
GLUCOSE: 151 mg/dL — AB (ref 65–99)
Potassium: 3.5 mmol/L (ref 3.5–5.1)
Sodium: 137 mmol/L (ref 135–145)

## 2017-01-25 LAB — I-STAT CG4 LACTIC ACID, ED: Lactic Acid, Venous: 1.75 mmol/L (ref 0.5–1.9)

## 2017-01-25 LAB — POC URINE PREG, ED: Preg Test, Ur: NEGATIVE

## 2017-01-25 LAB — LIPASE, BLOOD: Lipase: 22 U/L (ref 11–51)

## 2017-01-25 MED ORDER — ONDANSETRON 8 MG PO TBDP
8.0000 mg | ORAL_TABLET | Freq: Once | ORAL | Status: AC
  Administered 2017-01-25: 8 mg via ORAL
  Filled 2017-01-25: qty 1

## 2017-01-25 MED ORDER — ONDANSETRON 8 MG PO TBDP: ORAL_TABLET | ORAL | 0 refills | Status: DC

## 2017-01-25 NOTE — ED Notes (Addendum)
Pt reports vomiting after taking percocet around 8pm today. Pt also reports burning sensation in chest that resolved after taking sublingual Zofran

## 2017-01-25 NOTE — ED Provider Notes (Signed)
[facility]-EMERGENCY DEPT Provider Note   CSN: [id number] Arrival date & time: 01/24/17  2131     History   Chief Complaint Chief Complaint  Patient presents with  . Emesis    HPI Alexandra Barnes is a 46 y.o. female.  The history is provided by the patient. No language interpreter was used.  Emesis   This is a new problem. The current episode started 6 to 12 hours ago. The problem occurs 2 to 4 times per day. The problem has not changed since onset.The emesis has an appearance of stomach contents. There has been no fever. Pertinent negatives include no abdominal pain, no chills and no fever. Risk factors: 1 hour post taking percocet   took new percocet for dental pain and became sick after.    Past Medical History:  Diagnosis Date  . Anemia     Patient Active Problem List   Diagnosis Date Noted  . History of UTI 01/06/2017  . Hospital discharge follow-up 01/06/2017  . Lower urinary tract symptoms (LUTS) 01/06/2017  . Bacterial vaginosis 01/06/2017  . Dizziness 09/26/2016  . Chronic tension-type headache, not intractable 09/26/2016    History reviewed. No pertinent surgical history.  OB History    No data available       Home Medications    Prior to Admission medications   Medication Sig Start Date End Date Taking? Authorizing Provider  ibuprofen (ADVIL,MOTRIN) 200 MG tablet Take 400 mg by mouth every 6 (six) hours as needed for moderate pain.   Yes [provider]  oxyCODONE-acetaminophen (PERCOCET/ROXICET) 5-325 MG tablet Take 1 tablet by mouth every 4 (four) hours as needed for severe pain.   Yes [provider]    Family History History reviewed. No pertinent family history.  Social History Social History  Substance Use Topics  . Smoking status: Never Smoker  . Smokeless tobacco: Never Used  . Alcohol use No     Allergies   Patient has no known allergies.   Review of Systems Review of Systems    Constitutional: Negative for chills, diaphoresis and fever.  HENT: Negative for drooling and facial swelling.   Eyes: Negative for photophobia.  Respiratory: Negative for shortness of breath.   Cardiovascular: Negative for chest pain, palpitations and leg swelling.  Gastrointestinal: Positive for nausea and vomiting. Negative for abdominal pain and constipation.  Neurological: Negative for seizures, weakness and numbness.  All other systems reviewed and are negative.    Physical Exam Updated Vital Signs BP (!) 141/66 (BP Location: Right Arm)   Pulse 93   Temp (!) 97.5 F (36.4 C) (Oral)   Resp 19   LMP  (LMP Unknown)   SpO2 100%   Physical Exam  Constitutional: She is oriented to person, place, and time. She appears well-developed and well-nourished. No distress.  HENT:  Head: Normocephalic and atraumatic.  Right Ear: No hemotympanum.  Left Ear: No hemotympanum.  Mouth/Throat: No oropharyngeal exudate.  Eyes: Pupils are equal, round, and reactive to light. Conjunctivae are normal.  Neck: Normal range of motion. Neck supple.  Cardiovascular: Normal rate, regular rhythm, normal heart sounds and intact distal pulses.   Pulmonary/Chest: Effort normal and breath sounds normal. She has no wheezes. She has no rales.  Abdominal: Soft. Bowel sounds are normal. She exhibits no mass. There is no tenderness. There is no rebound and no guarding.  Musculoskeletal: Normal range of motion.  Neurological: She is alert and oriented to person, place, and time. She displays  normal reflexes.  Skin: Skin is warm and dry. Capillary refill takes less than 2 seconds.  Psychiatric: She has a normal mood and affect.     ED Treatments / Results   Vitals:   01/24/17 2235 01/25/17 0112  BP: (!) 149/46 (!) 141/66  Pulse: 72 93  Resp: 16 19  Temp: (!) 97.5 F (36.4 C)   SpO2: 98% 100%    Labs (all labs ordered are listed, but only abnormal results are displayed)  Results for orders placed or  performed during the hospital encounter of 01/25/17  CBC with Differential  Result Value Ref Range   WBC 10.8 (H) 4.0 - 10.5 K/uL   RBC 4.63 3.87 - 5.11 MIL/uL   Hemoglobin 12.7 12.0 - 15.0 g/dL   HCT 09.838.6 11.936.0 - 14.746.0 %   MCV 83.4 78.0 - 100.0 fL   MCH 27.4 26.0 - 34.0 pg   MCHC 32.9 30.0 - 36.0 g/dL   RDW 82.915.4 56.211.5 - 13.015.5 %   Platelets 283 150 - 400 K/uL   Neutrophils Relative % 79 %   Neutro Abs 8.5 (H) 1.7 - 7.7 K/uL   Lymphocytes Relative 15 %   Lymphs Abs 1.6 0.7 - 4.0 K/uL   Monocytes Relative 4 %   Monocytes Absolute 0.4 0.1 - 1.0 K/uL   Eosinophils Relative 2 %   Eosinophils Absolute 0.2 0.0 - 0.7 K/uL   Basophils Relative 0 %   Basophils Absolute 0.0 0.0 - 0.1 K/uL  Basic metabolic panel  Result Value Ref Range   Sodium 137 135 - 145 mmol/L   Potassium 3.5 3.5 - 5.1 mmol/L   Chloride 101 101 - 111 mmol/L   CO2 26 22 - 32 mmol/L   Glucose, Bld 151 (H) 65 - 99 mg/dL   BUN 9 6 - 20 mg/dL   Creatinine, Ser 8.650.61 0.44 - 1.00 mg/dL   Calcium 9.1 8.9 - 78.410.3 mg/dL   GFR calc non Af Amer >60 >60 mL/min   GFR calc Af Amer >60 >60 mL/min   Anion gap 10 5 - 15  Lipase, blood  Result Value Ref Range   Lipase 22 11 - 51 U/L  I-Stat CG4 Lactic Acid, ED  Result Value Ref Range   Lactic Acid, Venous 1.75 0.5 - 1.9 mmol/L  POC Urine Pregnancy, ED (do NOT order at [facility])  Result Value Ref Range   Preg Test, Ur NEGATIVE NEGATIVE   No results found.  EKG  EKG Interpretation  Date/Time:  Wednesday January 25 2017 01:11:34 EDT Ventricular Rate:  90 PR Interval:    QRS Duration: 106 QT Interval:  382 QTC Calculation: 468 R Axis:   43 Text Interpretation:  Sinus rhythm RSR' in V1 or V2, right VCD or RVH Confirmed by Nicanor AlconPalumbo, Reiner Loewen ([id number]) on 01/25/2017 2:44:48 AM       Radiology No results found.  Procedures Procedures (including critical care time)  Medications Ordered in ED Medications  ondansetron (ZOFRAN-ODT) disintegrating tablet 4 mg (4 mg Oral Given 01/24/17  2240)       Final Clinical Impressions(s) / ED Diagnoses   Stop medication, follow up with your dentist.  Strict return precautions given for  Shortness of breath, swelling or the lips or tongue, chest pain, dyspnea on exertion, new weakness or numbness changes in vision or speech,  Inability to tolerate liquids or food, changes in voice cough, altered mental status or any concerns. No signs of systemic illness or infection. The patient is nontoxic-appearing  on exam and vital signs are within normal limits.    I have reviewed the triage vital signs and the nursing notes. Pertinent labs &imaging results that were available during my care of the patient were reviewed by me and considered in my medical decision making (see chart for details).  After history, exam, and medical workup I feel the patient has been appropriately medically screened and is safe for discharge home. Pertinent diagnoses were discussed with the patient. Patient was given return precautions. New Prescriptions New Prescriptions   No medications on file     Yandel Zeiner, MD 01/25/17 [phone]

## 2018-02-12 ENCOUNTER — Ambulatory Visit: Payer: 59 | Admitting: Physician Assistant

## 2018-02-19 ENCOUNTER — Ambulatory Visit (HOSPITAL_COMMUNITY)
Admission: RE | Admit: 2018-02-19 | Discharge: 2018-02-19 | Disposition: A | Discharge status: Home / Self Care | Payer: Self-pay | Source: Ambulatory Visit | Attending: Physician Assistant | Admitting: Physician Assistant

## 2018-02-19 ENCOUNTER — Encounter: Payer: Self-pay | Admitting: Physician Assistant

## 2018-02-19 ENCOUNTER — Emergency Department (HOSPITAL_COMMUNITY)
Admission: EM | Admit: 2018-02-19 | Discharge: 2018-02-20 | Disposition: A | Discharge status: Home / Self Care | Payer: Self-pay | Attending: Emergency Medicine | Admitting: Emergency Medicine

## 2018-02-19 ENCOUNTER — Ambulatory Visit (INDEPENDENT_AMBULATORY_CARE_PROVIDER_SITE_OTHER): Payer: Self-pay | Admitting: Physician Assistant

## 2018-02-19 ENCOUNTER — Other Ambulatory Visit: Payer: Self-pay

## 2018-02-19 ENCOUNTER — Encounter (HOSPITAL_COMMUNITY): Payer: Self-pay | Admitting: Emergency Medicine

## 2018-02-19 ENCOUNTER — Ambulatory Visit: Payer: Self-pay | Admitting: Physician Assistant

## 2018-02-19 ENCOUNTER — Telehealth: Payer: Self-pay | Admitting: Emergency Medicine

## 2018-02-19 DIAGNOSIS — R1900 Intra-abdominal and pelvic swelling, mass and lump, unspecified site: Secondary | ICD-10-CM

## 2018-02-19 DIAGNOSIS — N1330 Unspecified hydronephrosis: Secondary | ICD-10-CM | POA: Insufficient documentation

## 2018-02-19 DIAGNOSIS — Z79899 Other long term (current) drug therapy: Secondary | ICD-10-CM | POA: Insufficient documentation

## 2018-02-19 LAB — POCT URINALYSIS DIP (MANUAL ENTRY)
BILIRUBIN UA: NEGATIVE
BILIRUBIN UA: NEGATIVE mg/dL
Glucose, UA: NEGATIVE mg/dL
Leukocytes, UA: NEGATIVE
NITRITE UA: NEGATIVE
PH UA: 6.5 (ref 5.0–8.0)
Protein Ur, POC: NEGATIVE mg/dL
Spec Grav, UA: 1.015 (ref 1.010–1.025)
Urobilinogen, UA: 0.2 E.U./dL

## 2018-02-19 LAB — COMPREHENSIVE METABOLIC PANEL
ALBUMIN: 4.1 g/dL (ref 3.5–5.0)
ALK PHOS: 72 U/L (ref 38–126)
ALT: 25 U/L (ref 0–44)
ANION GAP: 7 (ref 5–15)
AST: 22 U/L (ref 15–41)
BUN: 11 mg/dL (ref 6–20)
CALCIUM: 8.8 mg/dL — AB (ref 8.9–10.3)
CO2: 24 mmol/L (ref 22–32)
Chloride: 108 mmol/L (ref 98–111)
Creatinine, Ser: 0.63 mg/dL (ref 0.44–1.00)
GFR calc Af Amer: >60 mL/min (ref >60)
GFR calc non Af Amer: >60 mL/min (ref >60)
GLUCOSE: 104 mg/dL — AB (ref 70–99)
Potassium: 3.2 mmol/L — ABNORMAL LOW (ref 3.5–5.1)
Sodium: 139 mmol/L (ref 135–145)
Total Bilirubin: 0.3 mg/dL (ref 0.3–1.2)
Total Protein: 7.5 g/dL (ref 6.5–8.1)

## 2018-02-19 LAB — URINALYSIS, ROUTINE W REFLEX MICROSCOPIC
BILIRUBIN URINE: NEGATIVE
Bacteria, UA: NONE SEEN
GLUCOSE, UA: NEGATIVE mg/dL
Ketones, ur: NEGATIVE mg/dL
Leukocytes, UA: NEGATIVE
NITRITE: NEGATIVE
PROTEIN: NEGATIVE mg/dL
SPECIFIC GRAVITY, URINE: 1.031 — AB (ref 1.005–1.030)
pH: 6 (ref 5.0–8.0)

## 2018-02-19 LAB — CBC
HCT: 40.6 % (ref 36.0–46.0)
Hemoglobin: 12.7 g/dL (ref 12.0–15.0)
MCH: 26.8 pg (ref 26.0–34.0)
MCHC: 31.3 g/dL (ref 30.0–36.0)
MCV: 85.8 fL (ref 80.0–100.0)
Platelets: 249 10*3/uL (ref 150–400)
RBC: 4.73 MIL/uL (ref 3.87–5.11)
RDW: 14.8 % (ref 11.5–15.5)
WBC: 9.6 10*3/uL (ref 4.0–10.5)
nRBC: 0 % (ref 0.0–0.2)

## 2018-02-19 LAB — POCT URINE PREGNANCY: Preg Test, Ur: NEGATIVE

## 2018-02-19 MED ORDER — IOHEXOL 300 MG/ML  SOLN
100.0000 mL | Freq: Once | INTRAMUSCULAR | Status: AC | PRN
  Administered 2018-02-19: 100 mL via INTRAVENOUS

## 2018-02-19 MED ORDER — SODIUM CHLORIDE (PF) 0.9 % IJ SOLN
INTRAMUSCULAR | Status: AC
  Filled 2018-02-19: qty 50

## 2018-02-19 NOTE — Telephone Encounter (Signed)
Returned call to [facility] at [facility] who states she received a from Alexandra Barnes in the office and was given verbal orders. No other concerns voiced at this time.

## 2018-02-19 NOTE — ED Triage Notes (Addendum)
Pt presents with abnormal CT scan results concerning for uterine mass. With concern for renal obstruction

## 2018-02-19 NOTE — Progress Notes (Signed)
Alexandra Barnes  MRN: [id number] DOB: Mar 27, 1971  Subjective:   Alexandra Barnes is a 47 y.o. female who presents for evaluation of intermittent abdominal bloating. Onset was 7 months ago.  Diet consists of pork, chicken, beans, cheese. Food is very spicy. Aggravating factors: none.  Alleviating factors: OTC maalox. Associated symptoms: lump in abdomen, belching, acid regurg, indigestion, urinary frequency, and constipation. BM every 1-2 days. Last one yesterday. The patient denies anorexia, arthralagias, chills, diarrhea, dysuria, fever, flatus, headache, hematochezia, hematuria, melena, myalgias, nausea, sweats, vomiting, anuria, unexpected weight loss.  Not sure about pap smear, thinks she had it in the past year. Not currently sexually active. LMP 02/04/18. Cycles are heavy and painful. Last ~8 days. Sex is painful. FH of colon cancer in sister at age 29. Stratus interpreter used.  Review of Systems  Per HPI  Patient Active Problem List   Diagnosis Date Noted  . History of UTI 01/06/2017  . Hospital discharge follow-up 01/06/2017  . Lower urinary tract symptoms (LUTS) 01/06/2017  . Bacterial vaginosis 01/06/2017  . Dizziness 09/26/2016  . Chronic tension-type headache, not intractable 09/26/2016    Current Outpatient Medications on File Prior to Visit  Medication Sig Dispense Refill  . ibuprofen (ADVIL,MOTRIN) 200 MG tablet Take 400 mg by mouth every 6 (six) hours as needed for moderate pain.    Marland Kitchen ondansetron (ZOFRAN ODT) 8 MG disintegrating tablet 3m ODT q8 hours prn nausea (Patient not taking: Reported on 02/19/2018) 4 tablet 0  . oxyCODONE-acetaminophen (PERCOCET/ROXICET) 5-325 MG tablet Take 1 tablet by mouth every 4 (four) hours as needed for severe pain.     No current facility-administered medications on file prior to visit.     No Known Allergies    Social History   Socioeconomic History  . Marital status: Married    Spouse name: Not on file  . Number of children: 4   . Years of education: Not on file  . Highest education level: Not on file  Occupational History  . Not on file  Social Needs  . Financial resource strain: Not on file  . Food insecurity:    Worry: Not on file    Inability: Not on file  . Transportation needs:    Medical: Not on file    Non-medical: Not on file  Tobacco Use  . Smoking status: Never Smoker  . Smokeless tobacco: Never Used  Substance and Sexual Activity  . Alcohol use: No  . Drug use: No  . Sexual activity: Yes  Lifestyle  . Physical activity:    Days per week: Not on file    Minutes per session: Not on file  . Stress: Not on file  Relationships  . Social connections:    Talks on phone: Not on file    Gets together: Not on file    Attends religious service: Not on file    Active member of club or organization: Not on file    Attends meetings of clubs or organizations: Not on file    Relationship status: Not on file  . Intimate partner violence:    Fear of current or ex partner: Not on file    Emotionally abused: Not on file    Physically abused: Not on file    Forced sexual activity: Not on file  Other Topics Concern  . Not on file  Social History Narrative  . Not on file    Objective:  BP (!) 160/80   Pulse 82   Temp 98.7  F (37.1 C) (Oral)   Resp 18   Ht 5' 3.19" (1.605 m)   Wt 146 lb 12.8 oz (66.6 kg)   LMP 02/04/2018 (Approximate)   SpO2 97%   BMI 25.85 kg/m   Physical Exam  Constitutional: She is oriented to person, place, and time. She appears well-developed and well-nourished. No distress.  HENT:  Head: Normocephalic and atraumatic.  Eyes: Conjunctivae are normal.  Neck: Normal range of motion.  Pulmonary/Chest: Effort normal.  Abdominal: Bowel sounds are normal. She exhibits distension. There is no hepatomegaly. There is tenderness. There is no rigidity, no guarding, no tenderness at McBurney's point and negative Murphy's sign. No hernia.    Genitourinary: Uterus is not  deviated, not fixed and not tender.  Genitourinary Comments: Abdominal mass was palpated via bimanual exam to just above umbilicus.   Vaginal canal is shortened due to abdominal mass effect,cervix could no be visualized.   Discomfort noted with bimanual exam.   CMA chaperone present for GU exam.     Neurological: She is alert and oriented to person, place, and time.  Skin: Skin is warm and dry.  Psychiatric: She has a normal mood and affect.  Vitals reviewed.  Results for orders placed or performed in visit on 02/19/18 (from the past 24 hour(s))  POCT urinalysis dipstick     Status: Abnormal   Collection Time: 02/19/18  3:04 PM  Result Value Ref Range   Color, UA yellow yellow   Clarity, UA clear clear   Glucose, UA negative negative mg/dL   Bilirubin, UA negative negative   Ketones, POC UA negative negative mg/dL   Spec Grav, UA 1.015 1.010 - 1.025   Blood, UA trace-lysed (A) negative   pH, UA 6.5 5.0 - 8.0   Protein Ur, POC negative negative mg/dL   Urobilinogen, UA 0.2 0.2 or 1.0 E.U./dL   Nitrite, UA Negative Negative   Leukocytes, UA Negative Negative  POCT urine pregnancy     Status: None   Collection Time: 02/19/18  3:04 PM  Result Value Ref Range   Preg Test, Ur Negative Negative   Wt Readings from Last 3 Encounters:  02/19/18 146 lb 12.8 oz (66.6 kg)  01/06/17 131 lb 9.6 oz (59.7 kg)  09/26/16 129 lb 9.6 oz (58.8 kg)    Assessment and Plan :  1. Intra-abdominal and pelvic swelling, mass and lump, unspecified site This case was precepted with Dr. Brigitte Pulse, who also examined patient.  There is a large palpable mass noted with abdominal and GU exam.  Suspect GU etiology, however cannot determine that at this time.  Due to mass-effect on vaginal canal, believe transvaginal ultrasound would be difficult to obtain.  Recommend stat CT for further evaluation.  Patient is overall stable, no acute distress.  BP elevated, otherwise vital stable. - CBC with  Differential/Platelet - CMP14+EGFR - POCT urinalysis dipstick - CT Abdomen Pelvis W Contrast; Future - POCT urine pregnancy   Update: Stat CT of abdomen and pelvis shows a large 21 cm pelvic mass extending to lower abdomen.  Severe mass-effect on pelvic structures including both distal ureters with subsequent bilateral renal obstruction and bilateral hydronephrosis.  Tiny 6 mm low-density area in the liver is nonspecific but benign etiology is favored.  Consulted urologist on-call, Dr. Carlean Purl.  CMP was obtained but will not be resulted for at least 24 to 72 hours.  Dr. Carlean Purl recommended patient be evaluated emergently at the ED for stat CMP to ensure creatinine is stable and  that patient is not in acute renal failure.  Patient was contacted by Dr. Pamella Pert and informed of results and informed to go to [facility] ED.  Attempted to contact Elvina Sidle, ED triage nurse x 3. Finally, had the pleasure speaking RN Aaron Edelman. Made him aware of pt's case.   Tenna Delaine PA-C  [facility] 02/19/2018 2:21 PM

## 2018-02-19 NOTE — Telephone Encounter (Signed)
Copied from CRM [phone]#[id number]. Topic: Quick Communication - See Telephone Encounter >> Feb 19, 2018  7:08 PM Jens SomMedley, Jennifer A wrote: CRM for notification. See Telephone encounter for: 02/19/18. [facility] called to report a stat report.  The patient will need surgery consult.  Bjorn LoserRhonda tried to reach out to [place]any Wiseman on her cell with no answer.  Bjorn LoserRhonda can be reached up until 11pm.  Please advise [phone]

## 2018-02-19 NOTE — ED Provider Notes (Signed)
[facility]-EMERGENCY DEPT Provider Note   CSN: [id number] Arrival date & time: 02/19/18  1946     History   Chief Complaint Chief Complaint  Patient presents with  . abnormal labs    HPI Alexandra Barnes is a 47 y.o. female.  HPI Pt states she was told by the urgent care that she should come to the ED.   Pt went to the urgent care today for leg swelling and abdominal pain.  On exam she was noted to have a large pelvic mass.  She had an outpatient CT scan.  She was called back and instructed to come to the hospital.  Past Medical History:  Diagnosis Date  . Anemia     Patient Active Problem List   Diagnosis Date Noted  . History of UTI 01/06/2017  . Hospital discharge follow-up 01/06/2017  . Lower urinary tract symptoms (LUTS) 01/06/2017  . Bacterial vaginosis 01/06/2017  . Dizziness 09/26/2016  . Chronic tension-type headache, not intractable 09/26/2016    History reviewed. No pertinent surgical history.   OB History   None      Home Medications    Prior to Admission medications   Medication Sig Start Date End Date Taking? Authorizing Provider  alum & mag hydroxide-simeth (MAALOX/MYLANTA) 200-200-20 MG/5ML suspension Take 30 mLs by mouth every 6 (six) hours as needed for indigestion or heartburn.   Yes [provider]  OVER THE COUNTER MEDICATION Take 1 tablet by mouth daily. Bedoyecta-Mexican energy vitamin   Yes [provider]  ondansetron (ZOFRAN ODT) 8 MG disintegrating tablet 8mg  ODT q8 hours prn nausea Patient not taking: Reported on 02/19/2018 01/25/17   Nicanor Alcon, April, MD    Family History History reviewed. No pertinent family history.  Social History Social History   Tobacco Use  . Smoking status: Never Smoker  . Smokeless tobacco: Never Used  Substance Use Topics  . Alcohol use: No  . Drug use: No     Allergies   Patient has no known allergies.   Review of Systems Review of Systems  All other  systems reviewed and are negative.    Physical Exam Updated Vital Signs BP (!) 141/79 (BP Location: Right Arm)   Pulse 72   Temp 98.2 F (36.8 C) (Oral)   Resp 15   Ht 1.6 m (5\' 3" )   Wt 66 kg   LMP 02/04/2018 (Approximate)   SpO2 100%   BMI 25.77 kg/m   Physical Exam  Constitutional: She appears well-developed and well-nourished. No distress.  HENT:  Head: Normocephalic and atraumatic.  Right Ear: External ear normal.  Left Ear: External ear normal.  Eyes: Conjunctivae are normal. Right eye exhibits no discharge. Left eye exhibits no discharge. No scleral icterus.  Neck: Neck supple. No tracheal deviation present.  Cardiovascular: Normal rate, regular rhythm and intact distal pulses.  Pulmonary/Chest: Effort normal and breath sounds normal. No stridor. No respiratory distress. She has no wheezes. She has no rales.  Abdominal: Soft. Bowel sounds are normal. She exhibits no distension. There is no tenderness. There is no rebound and no guarding.  Fullness suprapubic region  Musculoskeletal: She exhibits no edema or tenderness.  Neurological: She is alert. She has normal strength. No cranial nerve deficit (no facial droop, extraocular movements intact, no slurred speech) or sensory deficit. She exhibits normal muscle tone. She displays no seizure activity. Coordination normal.  Skin: Skin is warm and dry. No rash noted.  Psychiatric: She has a normal mood and affect.  Nursing note and vitals reviewed.    ED Treatments / Results  Labs (all labs ordered are listed, but only abnormal results are displayed) Labs Reviewed  COMPREHENSIVE METABOLIC PANEL - Abnormal; Notable for the following components:      Result Value   Potassium 3.2 (*)    Glucose, Bld 104 (*)    Calcium 8.8 (*)    All other components within normal limits  CBC  URINALYSIS, ROUTINE W REFLEX MICROSCOPIC  CA 125    EKG None  Radiology Ct Abdomen Pelvis W Contrast  Result Date: 02/19/2018 CLINICAL  DATA:  47 year old female with intermittent abdominal bloating over the past 7 months. Abdominal and pelvic swelling. Family history of colon cancer. EXAM: CT ABDOMEN AND PELVIS WITH CONTRAST TECHNIQUE: Multidetector CT imaging of the abdomen and pelvis was performed using the standard protocol following bolus administration of intravenous contrast. CONTRAST:  OMNIPAQUE IOHEXOL 300 MG/ML  SOLN COMPARISON:  None. FINDINGS: Lower chest: Negative.  No pericardial or pleural effusion. Hepatobiliary: There is a 7 millimeter low-density area in the central liver which is too small to characterize but probably a benign cyst (series 2, image 15). Otherwise negative liver and gallbladder. Pancreas: Negative. Spleen: Negative. Adrenals/Urinary Tract: Normal adrenal glands. Bilateral hydronephrosis and hydroureter appears related to extrinsic compression of both distal ureters related to the bulky pelvic mass described below. Associated mass effect on the urinary bladder which is flattened against the ventral lower abdominal and pelvic wall. Stomach/Bowel: No dilated bowel. There is mass effect on the sigmoid colon and lower abdominal bowel loops related to the bulky pelvic mass described below. No bowel wall thickening. Diminutive and normal appendix. Oral contrast was administered but has not yet reached the distal small bowel. Negative stomach and duodenum. No free air, free fluid. Vascular/Lymphatic: Major arterial structures are patent and appear normal. The portal venous system is patent. No lymphadenopathy identified. Reproductive: Abnormal. There is a very large soft tissue mass extending out of the pelvis into the lower abdomen which encompasses 10.4 x 15.1 x 21.3 centimeters (estimated volume 1.6 L) axial series 2, image 58 and sagittal image 65 demonstrate and anteriorly displaced myometrium and endometrium with fairly homogeneous enhancement while the bulky soft tissue mass is relatively hypoenhancing,  lobulated, and has no normal discernible architecture. The left ovary is probably visible adjacent to the left uterine fundus on series 2, image 56 and coronal image 31. The right ovary is not identified. Other: No pelvic free fluid. Musculoskeletal: Mild lower lumbar facet degeneration. Mild nonspecific but benign appearing sclerosis of the medial right iliac bone on series 2, image 57. No acute or suspicious osseous lesion. IMPRESSION: 1. A large 21 cm pelvic mass extending into the lower abdomen could be arising from the uterus or the right ovary. Recommend GYN-Surgery consultation. There is no associated ascites or lymphadenopathy. 2. Up to severe mass effect on the other pelvic structures including both distal ureters with subsequent bilateral renal obstruction. 3. Tiny 6 mm low-density area in the liver is nonspecific but benign etiology is favored. These results will be called to the ordering clinician or representative by the Radiologist Assistant, and communication documented in the PACS or zVision Dashboard. Electronically Signed   By: Odessa Fleming M.D.   On: 02/19/2018 18:18    Procedures Procedures (including critical care time)  Medications Ordered in ED Medications - No data to display   Initial Impression / Assessment and Plan / ED Course  I have reviewed the triage vital  signs and the nursing notes.  Pertinent labs & imaging results that were available during my care of the patient were reviewed by me and considered in my medical decision making (see chart for details).  Clinical Course as of Feb 19 2317  Mon Feb 19, 2018  2315 D/w Dr Vergie LivingPickens.   I will send him an insbasket message so GYN onc can call to schedule a follow up appointment.  I will also add on a CA 125 to help with outpatient follow up.   [JK]    Clinical Course User Index [JK] Linwood DibblesKnapp, Kameko Hukill, MD   Patient presents to the emergency room for further evaluation of a pelvic mass.  Patient's laboratory tests do not show any  evidence of renal failure.  She does not appear to require hospitalization at this time or emergent renal stenting.  Patient will need outpatient surgical follow-up.  I discussed the case with Dr. Vergie LivingPickens and he will arrange to have the GYN oncology clinic call the patient to schedule outpatient surgery  Final Clinical Impressions(s) / ED Diagnoses   Final diagnoses:  Pelvic mass    ED Discharge Orders    None       Linwood DibblesKnapp, Tishia Maestre, MD 02/19/18 2318

## 2018-02-19 NOTE — Discharge Instructions (Signed)
The doctor's office should call you and schedule an appointment.  Please call them if you have not heard from them by the end of the week  El consultorio del mdico debe llamarlo y programar una cita.  Por favor, llmelos si no ha sabido de ellos al final de la semana

## 2018-02-19 NOTE — Patient Instructions (Addendum)
We will contact you with CT results and discuss further treatment plan. In the meantime, avoid spicy foods. Use over the counter pepcid for acid reflux and heartburn sensation.     Go straight to [facility]  If you have lab work done today you will be contacted with your lab results within the next 2 weeks.  If you have not heard from [place] then please contact us. The fastest way to get your results is to register for My Chart.  Enfermedad por reflujo gastroesofgico en los adultos (Gastroesophageal Reflux Disease, Adult) Normalmente, los alimentos descienden por el esfago y se depositan en el estmago para su digestin. Si una persona tiene enfermedad por reflujo gastroesofgico (ERGE), los alimentos y el cido estomacal regresan al esfago. Cuando esto ocurre, el esfago se irrita y se hincha (inflama). Con el tiempo, la ERGE puede provocar la formacin de pequeas perforaciones (lceras) en la mucosa del esfago. CUIDADOS EN EL HOGAR Dieta  Siga la dieta como se lo haya indicado el mdico. Tal vez deba evitar los siguientes alimentos y bebidas: ? Caf y t (con o sin cafena). ? Bebidas que contengan alcohol. ? Bebidas energizantes y deportivas. ? Gaseosas o refrescos. ? Chocolate y cacao. ? Menta y esencias de [address] ? Ajo y cebollas. ? Rbano picante. ? Alimentos muy condimentados y cidos, como pimientos, [place] en polvo, curry en polvo, vinagre, salsas picantes y Engineer, water. ? Frutas ctricas y sus jugos, como naranjas, limones y limas. ? Alimentos a base de tomates, como salsa roja, [place], salsa y pizza con salsa roja. ? Alimentos fritos y Lexicographer, como rosquillas, papas fritas y aderezos con alto contenido de Holiday representative. ? Carnes con alto contenido de [place], como hot dogs, filetes de entrecot, salchicha, jamn y tocino. ? Productos lcteos con alto contenido de grasa, como [place], [place] y Becker crema.  Consuma pequeas porciones de comida con ms frecuencia.  Evite consumir porciones abundantes.  Evite beber mucho lquido con las comidas.  No coma durante las 2 o 3horas previas a la hora de [place].  No se acueste inmediatamente despus de comer.  No haga actividad fsica enseguida despus de comer. Instrucciones generales  Est atento a cualquier cambio en los sntomas.  Tome los medicamentos de venta libre y los recetados solamente como se lo haya indicado el mdico. No tome aspirina, ibuprofeno ni otros antiinflamatorios no esteroides (AINE), a menos que el mdico lo autorice.  No consuma ningn producto que contenga tabaco, lo que incluye cigarrillos, tabaco de Theatre manager y Administrator, Civil Service. Si necesita ayuda para dejar de fumar, consulte al mdico.  Use ropa suelta. No use nada ajustado alrededor Reynolds American.  Levante (eleve) unas 6pulgadas (15centmetros) la cabecera de la cama.  Intente bajar el nivel de estrs. Si necesita ayuda para hacerlo, consulte al American Express.  Si tiene sobrepeso, Media planner un peso saludable. Pregntele a su mdico cmo puede perder peso de manera segura.  Concurra a todas las visitas de control como se lo haya indicado el mdico. Esto es importante. SOLICITE AYUDA SI:  Aparecen nuevos sntomas.  [place] y no sabe por qu.  Tiene dificultad para tragar o siente dolor al [employer]aur[employer].  Tiene sibilancias o tos que no desaparece.  Los sntomas no mejoran con Scientist, research (medical).  Tiene la voz ronca. SOLICITE AYUDA DE INMEDIATO SI:  Tiene dolor en los brazos, el cuello, los [place], la dentadura o la espalda.  Berenice Primas, se marea o tiene sensacin de desvanecimiento.  Siente falta de aire o Journalist, newspaperdolor en el pecho.  Vomita y el vmito es parecido a la sangre o a los granos de caf.  Pierde el conocimiento (se desmaya).  Las heces son sanguinolentas o de color negro.  No puede tragar, beber o comer. Esta informacin no tiene Theme park managercomo fin reemplazar el consejo del mdico. Asegrese de  hacerle al mdico cualquier pregunta que tenga. Document Released: 04/23/2010 Document Revised: 12/10/2014 Document Reviewed: 07/16/2014 Elsevier Interactive Patient Education  2018 [employer]   IF you received an x-ray today, you will receive an invoice from [facility]. Please contact [facility] at [phone] with questions or concerns regarding your invoice.   IF you received labwork today, you will receive an invoice from [facility]. Please contact [facility] at [phone] with questions or concerns regarding your invoice.   Our billing staff will not be able to assist you with questions regarding bills from these companies.  You will be contacted with the lab results as soon as they are available. The fastest way to get your results is to activate your My Chart account. Instructions are located on the last page of this paperwork. If you have not heard from us regarding the results in 2 weeks, please contact this office.

## 2018-02-20 ENCOUNTER — Other Ambulatory Visit: Payer: Self-pay | Admitting: *Deleted

## 2018-02-20 DIAGNOSIS — R1900 Intra-abdominal and pelvic swelling, mass and lump, unspecified site: Secondary | ICD-10-CM

## 2018-02-20 LAB — CMP14+EGFR
ALK PHOS: 85 IU/L (ref 39–117)
ALT: 23 IU/L (ref 0–32)
AST: 19 IU/L (ref 0–40)
Albumin/Globulin Ratio: 1.7 (ref 1.2–2.2)
Albumin: 4.7 g/dL (ref 3.5–5.5)
BUN/Creatinine Ratio: 15 (ref 9–23)
BUN: 11 mg/dL (ref 6–24)
Bilirubin Total: <0.2 mg/dL (ref 0.0–1.2)
CO2: 20 mmol/L (ref 20–29)
CREATININE: 0.74 mg/dL (ref 0.57–1.00)
Calcium: 9.2 mg/dL (ref 8.7–10.2)
Chloride: 104 mmol/L (ref 96–106)
GFR calc Af Amer: 112 mL/min/{1.73_m2} (ref >59)
GFR calc non Af Amer: 97 mL/min/{1.73_m2} (ref >59)
GLOBULIN, TOTAL: 2.7 g/dL (ref 1.5–4.5)
Glucose: 117 mg/dL — ABNORMAL HIGH (ref 65–99)
Potassium: 3.9 mmol/L (ref 3.5–5.2)
SODIUM: 141 mmol/L (ref 134–144)
Total Protein: 7.4 g/dL (ref 6.0–8.5)

## 2018-02-20 LAB — CBC WITH DIFFERENTIAL/PLATELET
Basophils Absolute: 0.1 10*3/uL (ref 0.0–0.2)
Basos: 1 %
EOS (ABSOLUTE): 0.7 10*3/uL — ABNORMAL HIGH (ref 0.0–0.4)
EOS: 8 %
Hematocrit: 39.9 % (ref 34.0–46.6)
Hemoglobin: 12.8 g/dL (ref 11.1–15.9)
Immature Grans (Abs): 0.1 10*3/uL (ref 0.0–0.1)
Immature Granulocytes: 1 %
LYMPHS ABS: 1.7 10*3/uL (ref 0.7–3.1)
Lymphs: 19 %
MCH: 27 pg (ref 26.6–33.0)
MCHC: 32.1 g/dL (ref 31.5–35.7)
MCV: 84 fL (ref 79–97)
MONOCYTES: 5 %
MONOS ABS: 0.5 10*3/uL (ref 0.1–0.9)
NEUTROS ABS: 6.3 10*3/uL (ref 1.4–7.0)
Neutrophils: 66 %
Platelets: 251 10*3/uL (ref 150–450)
RBC: 4.74 x10E6/uL (ref 3.77–5.28)
RDW: 14.3 % (ref 12.3–15.4)
WBC: 9.3 10*3/uL (ref 3.4–10.8)

## 2018-02-20 NOTE — Progress Notes (Signed)
Per Dr. Vergie LivingPickens' request, order placed for gyn oncology for a 21 cm pelvic mass.  Called Sue Lushndrea to complete the referral but she did not pick up.  Left message with pt information to start the referral and requested a call back to ensure message was received.

## 2018-02-20 NOTE — ED Notes (Signed)
Spanish interpreter # [phone] used for initial assessment of this patient with MD present.

## 2018-02-20 NOTE — ED Notes (Signed)
Spanish interpreter #   Used for DC of this patient.  Pt states understanding of DC instructions, readiness for DC.  Pt ambulating well at DC to go home with husband.

## 2018-02-21 LAB — CA 125: CANCER ANTIGEN (CA) 125: 24.7 U/mL (ref 0.0–38.1)

## 2018-02-23 ENCOUNTER — Telehealth: Payer: Self-pay | Admitting: *Deleted

## 2018-02-23 NOTE — Telephone Encounter (Signed)
Patient's spouse call trying to figure out when the patient needed to have surgery.  Called the interrupter line for assistance in spanish, Spanish interrupter [phone].  I told the patient's husband that we needed to schedule an appointment for the patient to come in and see one of our surgeons. Appointment is scheduled for Wednesday, November 27 th at 12:15pm with Dr. Doroteo GlassmanPhelps.  Patient's husband was very appreciative and verbalized understanding per interrupter.

## 2018-02-24 ENCOUNTER — Other Ambulatory Visit: Payer: Self-pay

## 2018-02-24 ENCOUNTER — Emergency Department (HOSPITAL_COMMUNITY)
Admission: EM | Admit: 2018-02-24 | Discharge: 2018-02-24 | Disposition: A | Discharge status: Home / Self Care | Payer: Self-pay | Attending: Emergency Medicine | Admitting: Emergency Medicine

## 2018-02-24 ENCOUNTER — Emergency Department (HOSPITAL_COMMUNITY): Payer: Self-pay

## 2018-02-24 ENCOUNTER — Encounter (HOSPITAL_COMMUNITY): Payer: Self-pay | Admitting: Obstetrics and Gynecology

## 2018-02-24 DIAGNOSIS — R109 Unspecified abdominal pain: Secondary | ICD-10-CM | POA: Insufficient documentation

## 2018-02-24 DIAGNOSIS — R1900 Intra-abdominal and pelvic swelling, mass and lump, unspecified site: Secondary | ICD-10-CM

## 2018-02-24 DIAGNOSIS — N939 Abnormal uterine and vaginal bleeding, unspecified: Secondary | ICD-10-CM

## 2018-02-24 DIAGNOSIS — Z79899 Other long term (current) drug therapy: Secondary | ICD-10-CM | POA: Insufficient documentation

## 2018-02-24 LAB — COMPREHENSIVE METABOLIC PANEL
ALBUMIN: 4.1 g/dL (ref 3.5–5.0)
ALK PHOS: 73 U/L (ref 38–126)
ALT: 17 U/L (ref 0–44)
AST: 35 U/L (ref 15–41)
Anion gap: 8 (ref 5–15)
BUN: 15 mg/dL (ref 6–20)
CALCIUM: 8.8 mg/dL — AB (ref 8.9–10.3)
CO2: 25 mmol/L (ref 22–32)
CREATININE: 0.86 mg/dL (ref 0.44–1.00)
Chloride: 108 mmol/L (ref 98–111)
GFR calc Af Amer: >60 mL/min (ref >60)
GFR calc non Af Amer: >60 mL/min (ref >60)
GLUCOSE: 89 mg/dL (ref 70–99)
Potassium: 4.4 mmol/L (ref 3.5–5.1)
SODIUM: 141 mmol/L (ref 135–145)
Total Bilirubin: 1.7 mg/dL — ABNORMAL HIGH (ref 0.3–1.2)
Total Protein: 7.6 g/dL (ref 6.5–8.1)

## 2018-02-24 LAB — CBC WITH DIFFERENTIAL/PLATELET
Abs Immature Granulocytes: 0.04 10*3/uL (ref 0.00–0.07)
BASOS ABS: 0 10*3/uL (ref 0.0–0.1)
BASOS PCT: 1 %
EOS ABS: 0.5 10*3/uL (ref 0.0–0.5)
EOS PCT: 6 %
HCT: 42.5 % (ref 36.0–46.0)
Hemoglobin: 13.3 g/dL (ref 12.0–15.0)
IMMATURE GRANULOCYTES: 1 %
LYMPHS ABS: 1.6 10*3/uL (ref 0.7–4.0)
LYMPHS PCT: 19 %
MCH: 26.9 pg (ref 26.0–34.0)
MCHC: 31.3 g/dL (ref 30.0–36.0)
MCV: 85.9 fL (ref 80.0–100.0)
MONOS PCT: 7 %
Monocytes Absolute: 0.6 10*3/uL (ref 0.1–1.0)
Neutro Abs: 5.5 10*3/uL (ref 1.7–7.7)
Neutrophils Relative %: 66 %
PLATELETS: 169 10*3/uL (ref 150–400)
RBC: 4.95 MIL/uL (ref 3.87–5.11)
RDW: 14.9 % (ref 11.5–15.5)
WBC: 8.2 10*3/uL (ref 4.0–10.5)
nRBC: 0 % (ref 0.0–0.2)

## 2018-02-24 LAB — URINALYSIS, ROUTINE W REFLEX MICROSCOPIC
BILIRUBIN URINE: NEGATIVE
GLUCOSE, UA: NEGATIVE mg/dL
KETONES UR: NEGATIVE mg/dL
NITRITE: NEGATIVE
PH: 6 (ref 5.0–8.0)
Protein, ur: NEGATIVE mg/dL
RBC / HPF: >50 RBC/hpf — ABNORMAL HIGH (ref 0–5)
SPECIFIC GRAVITY, URINE: 1.01 (ref 1.005–1.030)

## 2018-02-24 MED ORDER — OXYCODONE-ACETAMINOPHEN 5-325 MG PO TABS: 1.0000 | ORAL_TABLET | ORAL | 0 refills | Status: DC | PRN

## 2018-02-24 MED ORDER — OXYCODONE-ACETAMINOPHEN 5-325 MG PO TABS
1.0000 | ORAL_TABLET | Freq: Once | ORAL | Status: AC
  Administered 2018-02-24: 1 via ORAL
  Filled 2018-02-24: qty 1

## 2018-02-24 MED ORDER — ONDANSETRON 4 MG PO TBDP: 4.0000 mg | ORAL_TABLET | Freq: Three times a day (TID) | ORAL | 0 refills | Status: DC | PRN

## 2018-02-24 NOTE — Discharge Instructions (Addendum)
Follow up as Dr. Tamela OddiJackson-Moore discussed with you. I am giving you medication for nausea and pain.

## 2018-02-24 NOTE — ED Notes (Signed)
NP using translator phone to discuss options for treatment, possible admission and follow up prior to scheduled surgery. Husband stated that understands the options.

## 2018-02-24 NOTE — Progress Notes (Signed)
Reason for Consult: Abdominal pain, vaginal bleeding   Alexandra Barnes is an 47 y.o. female.   She has a newly diagnosed large abdominal/pelvic mass.  She has a long h/o abdominal bloating, GI complaints.  She denies any N/V or fever.  She had onset of vaginal bleeding 2 days ago.  The flow is heavier than her menses.  The colicky abdominal pain is incompletely relieved by NSAIDs.    Past Medical History:  Diagnosis Date  . Anemia     History reviewed. No pertinent surgical history.  No family history on file.  Social History:  reports that she has never smoked. She has never used smokeless tobacco. She reports that she does not drink alcohol or use drugs.  Allergies: No Known Allergies  Medications: I have reviewed the patient's current medications.  Results for orders placed or performed during the hospital encounter of 02/24/18 (from the past 48 hour(s))  Comprehensive metabolic panel     Status: Abnormal   Collection Time: 02/24/18  2:31 PM  Result Value Ref Range   Sodium 141 135 - 145 mmol/L   Potassium 4.4 3.5 - 5.1 mmol/L   Chloride 108 98 - 111 mmol/L   CO2 25 22 - 32 mmol/L   Glucose, Bld 89 70 - 99 mg/dL   BUN 15 6 - 20 mg/dL   Creatinine, Ser 0.86 0.44 - 1.00 mg/dL   Calcium 8.8 (L) 8.9 - 10.3 mg/dL   Total Protein 7.6 6.5 - 8.1 g/dL   Albumin 4.1 3.5 - 5.0 g/dL   AST 35 15 - 41 U/L   ALT 17 0 - 44 U/L   Alkaline Phosphatase 73 38 - 126 U/L   Total Bilirubin 1.7 (H) 0.3 - 1.2 mg/dL   GFR calc non Af Amer >60 >60 mL/min   GFR calc Af Amer >60 >60 mL/min    Comment: (NOTE) The eGFR has been calculated using the CKD EPI equation. This calculation has not been validated in all clinical situations. eGFR's persistently <60 mL/min signify possible Chronic Kidney Disease.    Anion gap 8 5 - 15    Comment: Performed at [facility], [place] [address], [place], [place] [address]  CBC with Differential     Status: None   Collection Time: 02/24/18   2:31 PM  Result Value Ref Range   WBC 8.2 4.0 - 10.5 K/uL   RBC 4.95 3.87 - 5.11 MIL/uL   Hemoglobin 13.3 12.0 - 15.0 g/dL   HCT 42.5 36.0 - 46.0 %   MCV 85.9 80.0 - 100.0 fL   MCH 26.9 26.0 - 34.0 pg   MCHC 31.3 30.0 - 36.0 g/dL   RDW 14.9 11.5 - 15.5 %   Platelets 169 150 - 400 K/uL    Comment: REPEATED TO VERIFY PLATELET COUNT CONFIRMED BY SMEAR    nRBC 0.0 0.0 - 0.2 %   Neutrophils Relative % 66 %   Neutro Abs 5.5 1.7 - 7.7 K/uL   Lymphocytes Relative 19 %   Lymphs Abs 1.6 0.7 - 4.0 K/uL   Monocytes Relative 7 %   Monocytes Absolute 0.6 0.1 - 1.0 K/uL   Eosinophils Relative 6 %   Eosinophils Absolute 0.5 0.0 - 0.5 K/uL   Basophils Relative 1 %   Basophils Absolute 0.0 0.0 - 0.1 K/uL   Smear Review MORPHOLOGY UNREMARKABLE    Immature Granulocytes 1 %   Abs Immature Granulocytes 0.04 0.00 - 0.07 K/uL    Comment: Performed  at [facility], [place] [address] [place], [place] [address]  Urinalysis, Routine w reflex microscopic     Status: Abnormal   Collection Time: 02/24/18  3:12 PM  Result Value Ref Range   Color, Urine YELLOW YELLOW   APPearance CLEAR CLEAR   Specific Gravity, Urine 1.010 1.005 - 1.030   pH 6.0 5.0 - 8.0   Glucose, UA NEGATIVE NEGATIVE mg/dL   Hgb urine dipstick LARGE (A) NEGATIVE   Bilirubin Urine NEGATIVE NEGATIVE   Ketones, ur NEGATIVE NEGATIVE mg/dL   Protein, ur NEGATIVE NEGATIVE mg/dL   Nitrite NEGATIVE NEGATIVE   Leukocytes, UA TRACE (A) NEGATIVE   RBC / HPF >50 (H) 0 - 5 RBC/hpf   WBC, UA 21-50 0 - 5 WBC/hpf   Bacteria, UA RARE (A) NONE SEEN   Squamous Epithelial / LPF 6-10 0 - 5    Comment: Performed at [facility], [place] [address] [place], [place] [address]    US Pelvic Complete With Transvaginal  Result Date: 02/24/2018 CLINICAL DATA:  47 y/o  F; pelvic mass.  Abdominal pain. EXAM: TRANSABDOMINAL AND TRANSVAGINAL ULTRASOUND OF PELVIS TECHNIQUE: Both transabdominal and transvaginal ultrasound  examinations of the pelvis were performed. Transabdominal technique was performed for global imaging of the pelvis including uterus, ovaries, adnexal regions, and pelvic cul-de-sac. It was necessary to proceed with endovaginal exam following the transabdominal exam to visualize the adnexa. COMPARISON:  02/19/2018 CT abdomen and pelvis. FINDINGS: Uterus Heterogeneous solid vascular mass within the mid pelvis extending into the lower abdomen measuring 15.7 x 10.1 x 11.7 cm (volume = 971 cm^3). At the anterior margin of the mass is a linear fluid structure probably representing lumen of the endometrium contiguous with the mass. Additionally, within the right adnexa there is a second contiguous solid mass measuring 6.9 x 5.1 x 4.8 cm. Endometrium Not definitely visualized, see above. Right ovary Not visualized. Left ovary Not visualized. Other findings No abnormal free fluid. IMPRESSION: Solid vascular mass within the mid pelvis and lower abdomen measuring up to 15.7 cm (971 cc). The mass probably arises from the posterior uterus as there is a small linear fluid cavity at the anterior margin probably representing the endometrial canal. 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 A second similar contiguous mass extends into the right adnexa measuring up to 6.9 cm. Normal ovaries are not visualized and the possibility of an ovarian tumor is not excluded. Electronically Signed   By: Kristine Garbe M.D.   On: 02/24/2018 17:22    Review of Systems  Constitutional: Negative for fever.  Cardiovascular: Positive for leg swelling.  Gastrointestinal: Positive for abdominal pain. Negative for nausea and vomiting.  Genitourinary:       Vaginal bleeding   Blood pressure 140/61, pulse 79, temperature 98.2 F (36.8 C), temperature source Oral, resp. rate 18, last menstrual period 02/04/2018, SpO2 99 %. Physical Exam  Constitutional: She is oriented to person, place, and time. She appears  well-developed and well-nourished.  Neck: Neck supple.  GI: She exhibits mass. She exhibits no distension. There is no tenderness. There is no rebound and no guarding.  Mass arising from the pelvis on the left to the umbilicus, firm, NT  Genitourinary:  Genitourinary Comments: Small heme on the pad  Neurological: She is alert and oriented to person, place, and time.  Skin: Skin is warm.    Assessment/Plan: Newly diagnosed large abdominal/pelvic mass.  Likely gynecologic origin.  AUB--?M, L, O. Pain controlled with oral analgesics.  Bleeding is not heavy/no demonstrable anemia.  >recommend d/c home; consider prescriptions for a narcotic, TXA >f/u w/GYN ONC next week  Lahoma Crocker 02/24/2018, 7:56 PM

## 2018-02-24 NOTE — ED Provider Notes (Signed)
[facility]-EMERGENCY DEPT Provider Note   CSN: [id number] Arrival date & time: 02/24/18  1325     History   Chief Complaint Chief Complaint  Patient presents with  . Vaginal Bleeding    HPI Alexandra Barnes is a 47 y.o.Spanish speaking female who presents to the ED with vaginal bleeding. Patient was evaluated and dx with with 21 cm pelvic mass 02/19/2018. Patient has GYN Oncology f/u scheduled for 02/28/18. Patient returns today with increased pain and bleeding.   The history is provided by the patient. A language interpreter was used.    Past Medical History:  Diagnosis Date  . Anemia     Patient Active Problem List   Diagnosis Date Noted  . History of UTI 01/06/2017  . Hospital discharge follow-up 01/06/2017  . Lower urinary tract symptoms (LUTS) 01/06/2017  . Bacterial vaginosis 01/06/2017  . Dizziness 09/26/2016  . Chronic tension-type headache, not intractable 09/26/2016    History reviewed. No pertinent surgical history.   OB History   None      Home Medications    Prior to Admission medications   Medication Sig Start Date End Date Taking? Authorizing Provider  alum & mag hydroxide-simeth (MAALOX/MYLANTA) 200-200-20 MG/5ML suspension Take 30 mLs by mouth every 6 (six) hours as needed for indigestion or heartburn.   Yes [provider]  ibuprofen (ADVIL,MOTRIN) 200 MG tablet Take 200 mg by mouth every 6 (six) hours as needed for moderate pain.   Yes [provider]  OVER THE COUNTER MEDICATION Take 1 tablet by mouth daily. Bedoyecta-Mexican energy vitamin   Yes [provider]  ondansetron (ZOFRAN ODT) 4 MG disintegrating tablet Take 1 tablet (4 mg total) by mouth every 8 (eight) hours as needed for nausea or vomiting. 02/24/18   Janne Napoleon, NP  oxyCODONE-acetaminophen (PERCOCET/ROXICET) 5-325 MG tablet Take 1-2 tablets by mouth every 4 (four) hours as needed for severe pain. 02/24/18   Janne Napoleon, NP     Family History No family history on file.  Social History Social History   Tobacco Use  . Smoking status: Never Smoker  . Smokeless tobacco: Never Used  Substance Use Topics  . Alcohol use: No  . Drug use: No     Allergies   Patient has no known allergies.   Review of Systems Review of Systems  Gastrointestinal: Positive for abdominal pain.  Genitourinary: Positive for vaginal bleeding.  All other systems reviewed and are negative.    Physical Exam Updated Vital Signs BP 140/61   Pulse 79   Temp 98.2 F (36.8 C) (Oral)   Resp 18   LMP 02/04/2018 (Approximate)   SpO2 99%   Physical Exam  Constitutional: She appears well-developed and well-nourished. No distress.  HENT:  Head: Normocephalic.  Eyes: EOM are normal.  Neck: Neck supple.  Cardiovascular: Normal rate.  Pulmonary/Chest: Effort normal.  Abdominal: She exhibits distension and mass. There is tenderness.  Genitourinary:  Genitourinary Comments: External genitalia without lesions. Moderate blood vaginal vault, unable to visualize cervix. Bimanual exam cervix not palpated, large uterine mass palpated above the umbilicus. Tender on exam.  Musculoskeletal: Normal range of motion.  Neurological: She is alert. No cranial nerve deficit.  Skin: Skin is warm and dry.  Psychiatric: She has a normal mood and affect.  Nursing note and vitals reviewed.  I discussed in detail using the language line plan of care that we are starting including blood work and ultrasound.   ED Treatments / Results  Labs (all labs ordered are listed, but only abnormal results are displayed) Labs Reviewed  COMPREHENSIVE METABOLIC PANEL - Abnormal; Notable for the following components:      Result Value   Calcium 8.8 (*)    Total Bilirubin 1.7 (*)    All other components within normal limits  URINALYSIS, ROUTINE W REFLEX MICROSCOPIC - Abnormal; Notable for the following components:   Hgb urine dipstick LARGE (*)     Leukocytes, UA TRACE (*)    RBC / HPF >50 (*)    Bacteria, UA RARE (*)    All other components within normal limits  CBC WITH DIFFERENTIAL/PLATELET    Radiology [place] Pelvic Complete With Transvaginal  Result Date: 02/24/2018 CLINICAL DATA:  47 y/o  F; pelvic mass.  Abdominal pain. EXAM: TRANSABDOMINAL AND TRANSVAGINAL ULTRASOUND OF PELVIS TECHNIQUE: Both transabdominal and transvaginal ultrasound examinations of the pelvis were performed. Transabdominal technique was performed for global imaging of the pelvis including uterus, ovaries, adnexal regions, and pelvic cul-de-sac. It was necessary to proceed with endovaginal exam following the transabdominal exam to visualize the adnexa. COMPARISON:  02/19/2018 CT abdomen and pelvis. FINDINGS: Uterus Heterogeneous solid vascular mass within the mid pelvis extending into the lower abdomen measuring 15.7 x 10.1 x 11.7 cm (volume = 971 cm^3). At the anterior margin of the mass is a linear fluid structure probably representing lumen of the endometrium contiguous with the mass. Additionally, within the right adnexa there is a second contiguous solid mass measuring 6.9 x 5.1 x 4.8 cm. Endometrium Not definitely visualized, see above. Right ovary Not visualized. Left ovary Not visualized. Other findings No abnormal free fluid. IMPRESSION: Solid vascular mass within the mid pelvis and lower abdomen measuring up to 15.7 cm (971 cc). The mass probably arises from the posterior uterus as there is a small linear fluid cavity at the anterior margin probably representing the endometrial canal. 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 A second similar contiguous mass extends into the right adnexa measuring up to 6.9 cm. Normal ovaries are not visualized and the possibility of an ovarian tumor is not excluded. Electronically Signed   By: Mitzi HansenLance  Furusawa-Stratton M.D.   On: 02/24/2018 17:22   Using the language line I discussed results of the blood work and  ultrasound. Patient and her husband still request that the GYN-oncology come in to discuss continued plan.   Procedures Procedures (including critical care time)  Medications Ordered in ED Medications  oxyCODONE-acetaminophen (PERCOCET/ROXICET) 5-325 MG per tablet 1 tablet (1 tablet Oral Given 02/24/18 1511)   5 pm consult with Dr. Tamela OddiJackson-Moore. Reviewed case and I will discuss with the patient plan of care and if patient still wants Dr. Tamela OddiJackson-Moore to come in she will 6:45pm using language line patient states she still wants to see the doctor before going home.  Dr. Tamela OddiJackson Moore to see the patient.  After Dr. Tamela OddiJackson-Moore saw the patient and discussed in detail plan of care patient is stable for d/c without hemorrhage and pain is controlled with Percocet. Will d/c home with Percocet and Zofran. Patient will f/u in clinic on Monday. D/c instructions using language line.   Initial Impression / Assessment and Plan / ED Course  I have reviewed the triage vital signs and the nursing notes.   Final Clinical Impressions(s) / ED Diagnoses   Final diagnoses:  Abdominal pain  Pelvic mass  Vaginal bleeding    ED Discharge Orders         Ordered    ondansetron (  ZOFRAN ODT) 4 MG disintegrating tablet  Every 8 hours PRN     02/24/18 2002    oxyCODONE-acetaminophen (PERCOCET/ROXICET) 5-325 MG tablet  Every 4 hours PRN     02/24/18 2002           Kerrie [place], NP 02/24/18 2045    Melene Plan, DO 02/24/18 2142

## 2018-02-24 NOTE — ED Notes (Signed)
Via translator: Pt has severe lower abdominal cramping with bleeding that "comes and goes". Pt is taking Advil with some relief, but the pain returns 9/10.

## 2018-02-24 NOTE — ED Triage Notes (Signed)
Pt reports she is having abdominal pain and vaginal bleeding. Pt reports she is still having periods.  It is reported that pt had an abnormal CT scan and they were previously concerned for kidney obstruction or uterine mass.

## 2018-02-26 ENCOUNTER — Encounter: Payer: Self-pay | Admitting: *Deleted

## 2018-02-26 ENCOUNTER — Other Ambulatory Visit (HOSPITAL_COMMUNITY)
Admission: RE | Admit: 2018-02-26 | Discharge: 2018-02-26 | Disposition: A | Discharge status: Home / Self Care | Payer: Self-pay | Source: Ambulatory Visit | Attending: Obstetrics | Admitting: Obstetrics

## 2018-02-26 ENCOUNTER — Inpatient Hospital Stay: Payer: Self-pay | Attending: Obstetrics | Admitting: Obstetrics

## 2018-02-26 ENCOUNTER — Encounter: Payer: Self-pay | Admitting: Obstetrics

## 2018-02-26 VITALS — BP 158/70 | HR 79 | Temp 98.1°F | Resp 18 | Wt 142.0 lb

## 2018-02-26 DIAGNOSIS — Z124 Encounter for screening for malignant neoplasm of cervix: Secondary | ICD-10-CM | POA: Insufficient documentation

## 2018-02-26 DIAGNOSIS — R978 Other abnormal tumor markers: Secondary | ICD-10-CM | POA: Insufficient documentation

## 2018-02-26 DIAGNOSIS — R1900 Intra-abdominal and pelvic swelling, mass and lump, unspecified site: Secondary | ICD-10-CM | POA: Insufficient documentation

## 2018-02-26 DIAGNOSIS — N1330 Unspecified hydronephrosis: Secondary | ICD-10-CM | POA: Insufficient documentation

## 2018-02-26 NOTE — Progress Notes (Signed)
GYNECOLOGIC ONCOLOGY  [facility]   Consult Note: New Patient FIRST VISIT   Consult was requested by the emergency room for large pelvic mass  Chief Complaint  Patient presents with  . Pelvic mass    Fibroid versus adnexal    GYN Oncologic Summary 1. TBD o .  HPI: [name]  is a nice 47 y.o.  P4  ~Mid November she noted lower extremity swelling and abdominal pain and went to an urgent care center. She was told she had a large pelvic mass on exam and a CTScan was ordered. The leg swelling has since resolved. The CTScan revealed (as noted below) a 10x15x20cm mass felt to be the right adnexa. Dr. Vergie LivingPickens with [facility] was notified and outpatient followup with Gyn Onc was recommended. A CA125 was ordered and was normal (as noted below).  She is somewhat of a poor historian. She represented to the ER about one week later with "bleeding" per the ER note, but when asked she states her periods are normal (only heavy) and denied intermenstrual bleeding. However on further questioning, through the interpreter, she states "one time" she had bleeding unexpectedly on 02/24/18 and that is when she went to the ER the second time. Upon even further questioning after her pelvic exam she admitted to 3 additional episodes of unexpected bleeding, once when her PCP did a "pap" and twice with intercourse. This history was obtained through the interpreter who also was having a difficult time getting an accurate history on her menstrual cycle. At this ER visit Gyn Onc was called and she was seen by Dr. Tamela OddiJackson-Moore and outpatient followup again recommended with her visit being set up for today.  We have no record of a Pap.  She has an appetite but admits to early satiety.  She is having more frequent bowel movements than usual but denies diarrhea.  She denies fever, N/V. Her discharge from the ER included Zofran, but she states this was "just in case".  Imported EPIC  Oncologic History:   No history exists.    Measurement of disease:  Recent Labs    02/19/18 2312  CAN125 24.7   CEA TBD  Radiology: Ct Abdomen Pelvis W Contrast  Result Date: 02/19/2018 CLINICAL DATA:  47 year old female with intermittent abdominal bloating over the past 7 months. Abdominal and pelvic swelling. Family history of colon cancer. EXAM: CT ABDOMEN AND PELVIS WITH CONTRAST TECHNIQUE: Multidetector CT imaging of the abdomen and pelvis was performed using the standard protocol following bolus administration of intravenous contrast. CONTRAST:  100mL OMNIPAQUE IOHEXOL 300 MG/ML  SOLN COMPARISON:  None. FINDINGS: Lower chest: Negative.  No pericardial or pleural effusion. Hepatobiliary: There is a 7 millimeter low-density area in the central liver which is too small to characterize but probably a benign cyst (series 2, image 15). Otherwise negative liver and gallbladder. Pancreas: Negative. Spleen: Negative. Adrenals/Urinary Tract: Normal adrenal glands. Bilateral hydronephrosis and hydroureter appears related to extrinsic compression of both distal ureters related to the bulky pelvic mass described below. Associated mass effect on the urinary bladder which is flattened against the ventral lower abdominal and pelvic wall. Stomach/Bowel: No dilated bowel. There is mass effect on the sigmoid colon and lower abdominal bowel loops related to the bulky pelvic mass described below. No bowel wall thickening. Diminutive and normal appendix. Oral contrast was administered but has not yet reached the distal small bowel. Negative stomach and duodenum. No free air, free fluid. Vascular/Lymphatic: Major arterial  structures are patent and appear normal. The portal venous system is patent. No lymphadenopathy identified. Reproductive: Abnormal. There is a very large soft tissue mass extending out of the pelvis into the lower abdomen which encompasses 10.4 x 15.1 x 21.3 centimeters (estimated volume 1.6 L)  axial series 2, image 58 and sagittal image 65 demonstrate and anteriorly displaced myometrium and endometrium with fairly homogeneous enhancement while the bulky soft tissue mass is relatively hypoenhancing, lobulated, and has no normal discernible architecture. The left ovary is probably visible adjacent to the left uterine fundus on series 2, image 56 and coronal image 31. The right ovary is not identified. Other: No pelvic free fluid. Musculoskeletal: Mild lower lumbar facet degeneration. Mild nonspecific but benign appearing sclerosis of the medial right iliac bone on series 2, image 57. No acute or suspicious osseous lesion. IMPRESSION: 1. A large 21 cm pelvic mass extending into the lower abdomen could be arising from the uterus or the right ovary. Recommend GYN-Surgery consultation. There is no associated ascites or lymphadenopathy. 2. Up to severe mass effect on the other pelvic structures including both distal ureters with subsequent bilateral renal obstruction. 3. Tiny 6 mm low-density area in the liver is nonspecific but benign etiology is favored. These results will be called to the ordering clinician or representative by the Radiologist Assistant, and communication documented in the PACS or zVision Dashboard. Electronically Signed   By: Odessa Fleming M.D.   On: 02/19/2018 18:18   US Pelvic Complete With Transvaginal  Result Date: 02/24/2018 CLINICAL DATA:  47 y/o  F; pelvic mass.  Abdominal pain. EXAM: TRANSABDOMINAL AND TRANSVAGINAL ULTRASOUND OF PELVIS TECHNIQUE: Both transabdominal and transvaginal ultrasound examinations of the pelvis were performed. Transabdominal technique was performed for global imaging of the pelvis including uterus, ovaries, adnexal regions, and pelvic cul-de-sac. It was necessary to proceed with endovaginal exam following the transabdominal exam to visualize the adnexa. COMPARISON:  02/19/2018 CT abdomen and pelvis. FINDINGS: Uterus Heterogeneous solid vascular mass within  the mid pelvis extending into the lower abdomen measuring 15.7 x 10.1 x 11.7 cm (volume = 971 cm^3). At the anterior margin of the mass is a linear fluid structure probably representing lumen of the endometrium contiguous with the mass. Additionally, within the right adnexa there is a second contiguous solid mass measuring 6.9 x 5.1 x 4.8 cm. Endometrium Not definitely visualized, see above. Right ovary Not visualized. Left ovary Not visualized. Other findings No abnormal free fluid. IMPRESSION: Solid vascular mass within the mid pelvis and lower abdomen measuring up to 15.7 cm (971 cc). The mass probably arises from the posterior uterus as there is a small linear fluid cavity at the anterior margin probably representing the endometrial canal. 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 A second similar contiguous mass extends into the right adnexa measuring up to 6.9 cm. Normal ovaries are not visualized and the possibility of an ovarian tumor is not excluded. Electronically Signed   By: Mitzi Hansen M.D.   On: 02/24/2018 17:22   .   Outpatient Encounter Medications as of 02/26/2018  Medication Sig  . ibuprofen (ADVIL,MOTRIN) 200 MG tablet Take 200 mg by mouth every 6 (six) hours as needed for moderate pain.  Marland Kitchen alum & mag hydroxide-simeth (MAALOX/MYLANTA) 200-200-20 MG/5ML suspension Take 30 mLs by mouth every 6 (six) hours as needed for indigestion or heartburn.  . ondansetron (ZOFRAN ODT) 4 MG disintegrating tablet Take 1 tablet (4 mg total) by mouth every 8 (eight) hours as needed for  nausea or vomiting. (Patient not taking: Reported on 02/26/2018)  . oxyCODONE-acetaminophen (PERCOCET/ROXICET) 5-325 MG tablet Take 1-2 tablets by mouth every 4 (four) hours as needed for severe pain. (Patient not taking: Reported on 02/26/2018)  . [DISCONTINUED] OVER THE COUNTER MEDICATION Take 1 tablet by mouth daily. Bedoyecta-Mexican energy vitamin   No facility-administered encounter  medications on file as of 02/26/2018.    No Known Allergies  Past Medical History:  Diagnosis Date  . History of anemia    Past Surgical History:  Procedure Laterality Date  . TUBAL LIGATION          Past Gynecological History:   GYNECOLOGIC HISTORY:  . Patient's last menstrual period was 02/04/2018 (approximate).  . Menarche: 47 years old . P 4 . Contraceptive prior OCP . HRT none  . Last Pap writes this was done in the emergency room no record; states done at PCP; no record Family Hx:  Family History  Problem Relation Age of Onset  . Colon cancer Sister 49   Social Hx:  Marland Kitchen Tobacco use: None . Alcohol use: None . Illicit Drug use: None . Illicit IV Drug use: None    Review of Systems: Review of Systems  Cardiovascular: Positive for chest pain.  Gastrointestinal: Positive for abdominal distention and abdominal pain.  Genitourinary: Positive for dyspareunia, dysuria, pelvic pain and vaginal bleeding.     Vitals:  Vitals:   02/26/18 1200  BP: (!) 158/70  Pulse: 79  Resp: 18  Temp: 98.1 F (36.7 C)  SpO2: 99%   Vitals:   02/26/18 1200  Weight: 142 lb (64.4 kg)   Body mass index is 25.15 kg/m.  Physical Exam: General :  Well developed, 47 y.o., female in no apparent distress HEENT:  Normocephalic/atraumatic, symmetric, EOMI, eyelids normal Neck:   Supple, no masses.  Lymphatics:  No cervical/ submandibular/ supraclavicular/ infraclavicular/ inguinal adenopathy Respiratory:  Respirations unlabored, no use of accessory muscles CV:   Deferred Breast:  Deferred Musculoskeletal: No CVA tenderness, normal muscle strength. Abdomen:   Soft, non-tender and nondistended. No evidence of hernia. Firm lower pelvis. Extremities:  No lymphedema, no erythema, non-tender. Skin:   Normal inspection Neuro/Psych:  No focal motor deficit, no abnormal mental status. Normal gait. Normal affect. Alert and oriented to person, place, and time  Genito Urinary: Vulva: Normal  external female genitalia.  Bladder/urethra: Urethral meatus normal in size and location. No lesions or   masses, well supported bladder Speculum exam: Vagina: Unable to detect any visible lesion, but limited exam due to mass effect. Moderate blood in vault. Cervix: Unable to visualize. On palpation I believe it is quite anterior and behind the symphysis but I cannot palpate normal ectocervix or an os to be certain. A blind pap was attempted. No fungating lesion Bimanual exam:  Uterus: Unable to delineate given mass effect. Inseparable from pelvic mass wedged in cul-de-sac.  Adnexal region: Complete obliteration of culdesac by deep pelvic mass. Unable to elevate out of pelvis.  Rectovaginal:  Parametrial difficult to delineate given mass effect. No obvious nodularity. Rectum is free of lesion on palpation.   Assessment  Deep pelvic mass - posterior fibroid versus adnexal ECOG PERFORMANCE STATUS: 1 - Symptomatic but completely ambulatory  Plan  1. Etiology of mass ? Based on examination I suspect a posterior LUS/cervical fibroid, however imaging suggests potential this is adnexal ? I did personally review the images with radiology and neither of [place] is convinced as to the etiology. CT favors adnexal (based on radiology  interpretation, [place] favors uterine origin. ? CA125 is normal. I will order a CEA ? I am unable to clear the uterus or cervix of any malignant process due to the inability to access the cervix/uterus. ? The mass effect is so significant I do not think an EUA will help ? There is no fungating lesion or irregular contour to the mass to make me think this is a cervical CA ? A blind Pap was done in the suspected location of the cervix. ? I considered an MRI but I don't think it will change the management since she has bilateral hydronephrosis 2. Surgical discussion with assistance of the interpreter and surgical sketch ? If adnexal and benign then plan for USO; if malignant then  TAH/BSO/Staging ? If a fibroid then TAH/bilateral salpingectomy ? I worry about blood loss given the lesion size and location and have recommended Cell Saver. This was discussed with the patient ? She understands she could experience life-threatening blood loss 3. A copy of the surgical sketch was reviewed with risks, benefits, and alternatives explained. She was given a copy of this today.  Face to face time with patient was 80 minutes. Over 50% of this time was spent on counseling and coordination of care.   Rolm Baptise, MD Gynecologic Oncologist 02/27/2018, 11:52 AM    Cc: Georgina Quint, MD (PCP)

## 2018-02-26 NOTE — Patient Instructions (Signed)
Preparing for your Surgery  Plan for surgery on March 08, 2018 with Dr. Tawnya Crook at [facility]. You will be scheduled for an exploratory laparotomy, unilateral salpingo-oophorectomy, possible bilateral salpingo-oophorectomy, possible total abdominal hysterectomy, possible staging.   Pre-operative Testing -You will receive a phone call from presurgical testing at [facility] to arrange for a pre-operative testing appointment before your surgery.  This appointment normally occurs one to two weeks before your scheduled surgery.   -Bring your insurance card, copy of an advanced directive if applicable, medication list  -At that visit, you will be asked to sign a consent for a possible blood transfusion in case a transfusion becomes necessary during surgery.  The need for a blood transfusion is rare but having consent is a necessary part of your care.     -You should not be taking blood thinners or aspirin at least ten days prior to surgery unless instructed by your surgeon.  Day Before Surgery at Home -You will be asked to take in a light diet the day before surgery.  Avoid carbonated beverages.  You will be advised to have nothing to eat or drink after midnight the evening before.    Eat a light diet the day before surgery.  Examples including soups, broths, toast, yogurt, mashed potatoes.  Things to avoid include carbonated beverages (fizzy beverages), raw fruits and raw vegetables, or beans.   If your bowels are filled with gas, your surgeon will have difficulty visualizing your pelvic organs which increases your surgical risks.  Your role in recovery Your role is to become active as soon as directed by your doctor, while still giving yourself time to heal.  Rest when you feel tired. You will be asked to do the following in order to speed your recovery:  - Cough and breathe deeply. This helps toclear and expand your lungs and can prevent pneumonia. You  may be given a spirometer to practice deep breathing. A staff member will show you how to use the spirometer. - Do mild physical activity. Walking or moving your legs help your circulation and body functions return to normal. A staff member will help you when you try to walk and will provide you with simple exercises. Do not try to get up or walk alone the first time. - Actively manage your pain. Managing your pain lets you move in comfort. We will ask you to rate your pain on a scale of zero to 10. It is your responsibility to tell your doctor or nurse where and how much you hurt so your pain can be treated.  Special Considerations -If you are diabetic, you may be placed on insulin after surgery to have closer control over your blood sugars to promote healing and recovery.  This does not mean that you will be discharged on insulin.  If applicable, your oral antidiabetics will be resumed when you are tolerating a solid diet.  -Your final pathology results from surgery should be available around one week after surgery and the results will be relayed to you when available.  -Dr. Antionette Char is the Surgeon that assists your GYN Oncologist with surgery.  The next day after your surgery you will either see your GYN Oncologist, Dr. Adolphus Birchwood, or Dr. Antionette Char.  -FMLA forms can be faxed to [phone] and please allow 5-7 business days for completion.   Transfusin de sangre  (Blood Transfusion) Neomia Dear transfusin de sangre es un procedimiento en el cual se recibe sangre a  travs de una va intravenosa. Puede necesitar una transfusin de sangre por una enfermedad, Azerbaijanciruga o lesin. La sangre puede provenir de un donante o puede ser su propia sangre donada previamente. La sangre administrada en una transfusin se compone de diferentes tipos de clulas. Puede recibir lo siguiente:  Glbulos rojos. Estos transportan oxgeno y Orthoptistreemplazan la sangre perdida.  Plaquetas. Estas controlan el  sangrado.  Plasma. Este ayuda a la coagulacin sangunea. Si tiene hemofilia u otro trastorno de [place] Beachcoagulacin, tambin puede recibir otro tipo de hemoderivados. INFORME A SU MDICO:  Cualquier alergia que tenga.  Todos los Walt Disneymedicamentos que utiliza, incluidos vitaminas, hierbas, gotas oftlmicas, cremas y [address]amentos de [address] libre.  Problemas previos que usted o los Graybar Electricmiembros de su familia hayan tenido con el uso de anestsicos.  Enfermedades de la sangre que tenga.  Si tiene cirugas previas.  Si tiene Owens-Illinoisalguna enfermedad.  Cualquier reaccin previa que haya tenido durante una transfusin sangunea.  RIESGOS Y COMPLICACIONES En general, se trata de un procedimiento seguro. Sin embargo, pueden presentarse problemas, por ejemplo:  Runner, broadcasting/film/videoUna reaccin alrgica a algn componente de la sangre donada.  Grant RutsFiebre. Esta puede ser Neomia Dearuna reaccin a los glbulos blancos de la sangre transfundida.  Sobrecarga de hierro. Esto puede suceder por haber recibido muchas transfusiones.  Lesin pulmonar aguda relacionada con la transfusin (LPART). Esta es una reaccin poco frecuente que causa dao pulmonar. La causa no se conoce.Esta lesin puede ocurrir unas horas o varios das despus de la transfusin.  Reacciones hemolticas repentinas (agudas) o lentas. Esto sucede si la sangre no es compatible con las clulas de la transfusin. El sistema de defensa del cuerpo (sistema inmunitario) puede tratar de atacar a las clulas nuevas. Esta complicacin es poco frecuente.  Infeccin. Esto es raro. ANTES DEL PROCEDIMIENTO  Es posible que le realicen un anlisis de sangre para determinar su tipo de Weimarsangre. Esto es necesario para saber qu clase de sangre aceptar el cuerpo.  Si planifica someterse a [place] and Herz[place]una ciruga, puede donar su propia sangre. Esto es en caso de que necesite una transfusin.  Si tuvo una reaccin alrgica a una transfusin en el pasado, tal vez le administren un medicamento que ayude a evitarla.  Tome este medicamento solamente como se lo haya indicado el mdico.  Le controlarn la temperatura, la presin arterial y el pulso antes de la transfusin. PROCEDIMIENTO   Le insertarn una va intravenosa en el brazo o la [place].  La bolsa de sangre donada se conectar a la va intravenosa y Contractorse le administrar en la vena.  Le controlarn con frecuencia la temperatura, la presin arterial y el pulso durante de la transfusin. Este control se realiza para detectar signos tempranos de una reaccin a la transfusin.  Si tiene signos o sntomas de Corporate treasureruna reaccin, se suspender la transfusin y tal vez le administren un medicamento.  Cuando finalice la transfusin, le retirarn la va intravenosa.  Se puede aplicar presin en el lugar donde se coloc la va intravenosa.  Le colocarn una venda (vendaje). Este procedimiento puede variar segn el mdico y el hospital. DESPUS DEL PROCEDIMIENTO  Le controlarn la presin arterial, la temperatura y el pulso peridicamente. Esta informacin no tiene Theme park managercomo fin reemplazar el consejo del mdico. Asegrese de hacerle al mdico cualquier pregunta que tenga. Document Released: 03/21/2005 Document Revised: 04/11/2014 Elsevier Interactive Patient Education  2017 [employer]

## 2018-02-27 ENCOUNTER — Telehealth: Payer: Self-pay | Admitting: Oncology

## 2018-02-27 NOTE — Telephone Encounter (Addendum)
Called Dois DavenportSandra at [facility] and scheduled pre-op appointment with Dr. Job Foundsockery on 03/05/2018 with surgery planned for 03/19/2018. Advised her that patient requires an interpreter. Records were faxed to [phone].  Called Raynelle FanningJulie to let patient know Dr. Doroteo GlassmanPhelps has [facility] and that they will be calling her with the appointment.

## 2018-02-27 NOTE — Telephone Encounter (Signed)
Called Sherri at [facility] and [facility] # of [id number].  Left a message for Dois DavenportSandra to send in referral.

## 2018-02-28 ENCOUNTER — Ambulatory Visit: Payer: Self-pay | Admitting: Obstetrics

## 2018-02-28 LAB — CYTOLOGY - PAP
Adequacy: ABSENT
Diagnosis: NEGATIVE

## 2018-03-05 ENCOUNTER — Encounter: Payer: Self-pay | Admitting: Oncology

## 2018-03-05 NOTE — Progress Notes (Signed)
Requested to powershare to [facility] and [facility] with [facility].

## 2018-03-15 ENCOUNTER — Other Ambulatory Visit: Payer: Self-pay

## 2018-03-15 ENCOUNTER — Encounter (HOSPITAL_COMMUNITY): Payer: Self-pay | Admitting: Emergency Medicine

## 2018-03-15 ENCOUNTER — Emergency Department (HOSPITAL_COMMUNITY)
Admission: EM | Admit: 2018-03-15 | Discharge: 2018-03-15 | Disposition: A | Discharge status: Home / Self Care | Payer: Self-pay | Attending: Emergency Medicine | Admitting: Emergency Medicine

## 2018-03-15 DIAGNOSIS — R1030 Lower abdominal pain, unspecified: Secondary | ICD-10-CM | POA: Insufficient documentation

## 2018-03-15 DIAGNOSIS — D62 Acute posthemorrhagic anemia: Secondary | ICD-10-CM | POA: Insufficient documentation

## 2018-03-15 LAB — BASIC METABOLIC PANEL
Anion gap: 9 (ref 5–15)
BUN: 9 mg/dL (ref 6–20)
CHLORIDE: 107 mmol/L (ref 98–111)
CO2: 25 mmol/L (ref 22–32)
CREATININE: 0.64 mg/dL (ref 0.44–1.00)
Calcium: 8.3 mg/dL — ABNORMAL LOW (ref 8.9–10.3)
GFR calc Af Amer: >60 mL/min (ref >60)
GFR calc non Af Amer: >60 mL/min (ref >60)
GLUCOSE: 108 mg/dL — AB (ref 70–99)
POTASSIUM: 3.5 mmol/L (ref 3.5–5.1)
Sodium: 141 mmol/L (ref 135–145)

## 2018-03-15 LAB — CBC
HCT: 25.5 % — ABNORMAL LOW (ref 36.0–46.0)
Hemoglobin: 7.9 g/dL — ABNORMAL LOW (ref 12.0–15.0)
MCH: 27.1 pg (ref 26.0–34.0)
MCHC: 31 g/dL (ref 30.0–36.0)
MCV: 87.3 fL (ref 80.0–100.0)
Platelets: 210 10*3/uL (ref 150–400)
RBC: 2.92 MIL/uL — AB (ref 3.87–5.11)
RDW: 14.8 % (ref 11.5–15.5)
WBC: 11.8 10*3/uL — AB (ref 4.0–10.5)
nRBC: 0 % (ref 0.0–0.2)

## 2018-03-15 LAB — URINALYSIS, ROUTINE W REFLEX MICROSCOPIC
BILIRUBIN URINE: NEGATIVE
Glucose, UA: NEGATIVE mg/dL
Ketones, ur: 5 mg/dL — AB
Nitrite: NEGATIVE
Protein, ur: NEGATIVE mg/dL
SPECIFIC GRAVITY, URINE: 1.009 (ref 1.005–1.030)
pH: 8 (ref 5.0–8.0)

## 2018-03-15 LAB — I-STAT BETA HCG BLOOD, ED (MC, WL, AP ONLY): I-stat hCG, quantitative: <5 m[IU]/mL (ref <5)

## 2018-03-15 LAB — DIFFERENTIAL
BASOS ABS: 0 10*3/uL (ref 0.0–0.1)
BASOS PCT: 0 %
EOS PCT: 9 %
Eosinophils Absolute: 1.1 10*3/uL — ABNORMAL HIGH (ref 0.0–0.5)
LYMPHS PCT: 16 %
Lymphs Abs: 1.8 10*3/uL (ref 0.7–4.0)
MONOS PCT: 5 %
Monocytes Absolute: 0.5 10*3/uL (ref 0.1–1.0)
NEUTROS ABS: 7.7 10*3/uL (ref 1.7–7.7)
Neutrophils Relative %: 69 %

## 2018-03-15 LAB — CBG MONITORING, ED: Glucose-Capillary: 95 mg/dL (ref 70–99)

## 2018-03-15 MED ORDER — FERROUS SULFATE 325 (65 FE) MG PO TABS: 325.0000 mg | ORAL_TABLET | Freq: Two times a day (BID) | ORAL | 0 refills | Status: AC

## 2018-03-15 MED ORDER — ONDANSETRON HCL 4 MG PO TABS: 4.0000 mg | ORAL_TABLET | Freq: Four times a day (QID) | ORAL | 0 refills | Status: AC

## 2018-03-15 NOTE — ED Notes (Signed)
Interpreter used to provide discharge information and to review prescriptions.

## 2018-03-15 NOTE — ED Triage Notes (Addendum)
Patient is complaining because she is weak. Patient [facility]. Patient states she lost a lot of blood and that is why she is weak. Patient states she feels tired.

## 2018-03-15 NOTE — Discharge Instructions (Addendum)
Gracias por permitirme cuidardelo hoy en el Dep[address]amento de [address].   Mantenga su cita de seguimiento [facility] con ginecologa a principios de enero.  Tome 1 comprimido de hierro Restaurant manager, fast food(sulfato ferroso) con alimentos por la maana y 1 comprimido con alimentos por la noche.   Puede tomar 1 comprimido de Zofran cada 6 horas segn sea necesario para las nuseas y los vmitos si su analgsico le est haciendo sentir mal.   Usted debe volver al servicio de urgencias si desarrolla fiebre alta con dolor abdominal incontrolable, vmitos persistentes a pesar de tomar Zofran, dificultad para respirar con dolor en el pecho que es peor cuando respira profundamente, dificultad para respirar que se convierte en constante, si se desmaya, o si desarrolla una cantidad significativa de sangrado.  Thank you for allowing me to care for you today in the Emergency Department.   [facility] [place] with gynecology at the beginning of January.  Take 1 tablet of iron (ferrous sulfate) with food in the morning and 1 tablet with food at night.   You can take 1 tablet of Zofran every 6 hours as needed for nausea and vomiting if your pain medicine is making you feel ill.   You should return to the emergency department if you develop a high fever with uncontrollable abdominal pain, persistent vomiting despite taking Zofran, shortness of breath with chest pain that is worse when you take a deep breath, shortness of breath that becomes constant, if you pass out, or if you develop a significant amount of bleeding.

## 2018-03-15 NOTE — ED Provider Notes (Signed)
[facility]-EMERGENCY DEPT Provider Note   CSN: [id number] Arrival date & time: 03/15/18  0407     History   Chief Complaint Chief Complaint  Patient presents with  . Weakness    HPI Alexandra Barnes is a 47 y.o. female with a history of anemia, LUTS, BV, and chronic tension-type headache who presents to the emergency department with a chief complaint of dyspnea.  The patient reports that she underwent a total hysterectomy 3 days ago at [facility] for a leiomyoma and was discharged late yesterday afternoon.  She states that she was told by her surgeon that she lost a lot of blood during the surgery.  She reports that they attempted to discharge her yesterday morning, but she was feeling dizzy.  Dizziness resolved and she was deemed safe for discharge to home with outpatient follow-up.  She reports that she awoke suddenly at 2 in the morning and was extremely short of breath while laying flat.  She reports that her husband helped her to sit upright and dyspnea resolved.  She has had no additional episodes of dyspnea.  She denies dyspnea with exertion, chest pain, palpitations, fatigue, pallor, leg swelling, chest tightness, wheezing, fever, or chills.  She reports that she has had a nonproductive cough for the last few days since the surgery.  She states that she feels some irritation in her throat and right ear that she thinks may be from being intubated.  She reports intermittent abdominal pain in her lower abdomen near the surgical site.  She has been taking ibuprofen and Tylenol at home for pain control.  She states that when she is sitting still that pain is minimal, but when she changes position pain increases to 9 out of 10.  She was given oxycodone for pain control, but has not been taking it because previously she had had nausea and vomiting with taking this medication.  She denies bleeding since the surgery.  Reports that she has had 2 bowel movements since  the surgery.  She has been eating and drinking without difficulty.  The history is provided by the patient. The history is limited by a language barrier. A language interpreter was used ([place]).    Past Medical History:  Diagnosis Date  . History of anemia     Patient Active Problem List   Diagnosis Date Noted  . History of UTI 01/06/2017  . Hospital discharge follow-up 01/06/2017  . Lower urinary tract symptoms (LUTS) 01/06/2017  . Bacterial vaginosis 01/06/2017  . Dizziness 09/26/2016  . Chronic tension-type headache, not intractable 09/26/2016    Past Surgical History:  Procedure Laterality Date  . TUBAL LIGATION       OB History   No obstetric history on file.      Home Medications    Prior to Admission medications   Medication Sig Start Date End Date Taking? Authorizing Provider  acetaminophen (TYLENOL) 500 MG tablet Take 500 mg by mouth every 6 (six) hours as needed for mild pain.   Yes [provider]  ibuprofen (ADVIL,MOTRIN) 200 MG tablet Take 200 mg by mouth every 6 (six) hours as needed for moderate pain.   Yes [provider]  ferrous sulfate 325 (65 FE) MG tablet Take 1 tablet (325 mg total) by mouth 2 (two) times daily with a meal. 03/15/18 04/14/18  Carrolyn Hilmes A, PA-C  ondansetron (ZOFRAN) 4 MG tablet Take 1 tablet (4 mg total) by mouth every 6 (six) hours. 03/15/18  Laretta Pyatt A, PA-C    Family History Family History  Problem Relation Age of Onset  . Colon cancer Sister 20    Social History Social History   Tobacco Use  . Smoking status: Never Smoker  . Smokeless tobacco: Never Used  Substance Use Topics  . Alcohol use: No  . Drug use: No     Allergies   Patient has no known allergies.   Review of Systems Review of Systems  Constitutional: Negative for activity change, chills and fever.  HENT: Positive for ear pain and sore throat. Negative for mouth sores.   Respiratory: Positive for cough and shortness of  breath.   Cardiovascular: Negative for chest pain and palpitations.  Gastrointestinal: Positive for abdominal pain. Negative for abdominal distention, constipation, nausea and vomiting.  Genitourinary: Negative for dysuria and vaginal bleeding.  Musculoskeletal: Negative for back pain.  Skin: Negative for rash.  Allergic/Immunologic: Negative for immunocompromised state.  Neurological: Negative for dizziness, seizures, weakness, numbness and headaches.  Psychiatric/Behavioral: Negative for confusion.     Physical Exam Updated Vital Signs BP 137/61   Pulse 77   Temp 98.1 F (36.7 C) (Oral)   Resp 17   Ht 5\' 3"  (1.6 m)   Wt 66 kg   LMP 02/04/2018   SpO2 99%   BMI 25.77 kg/m   Physical Exam Vitals signs and nursing note reviewed.  Constitutional:      General: She is not in acute distress.    Comments: Well appearing. NAD.   HENT:     Head: Normocephalic.     Right Ear: Hearing normal.     Left Ear: Hearing, tympanic membrane and ear canal normal.     Ears:     Comments: There appears to be chronic scarring to the right TM.  No bulging or erythema.  No mastoid tenderness bilaterally.    Mouth/Throat:     Mouth: Mucous membranes are moist.     Pharynx: Oropharynx is clear. Uvula midline. No pharyngeal swelling, posterior oropharyngeal erythema or uvula swelling.  Eyes:     Conjunctiva/sclera: Conjunctivae normal.  Neck:     Musculoskeletal: Neck supple. No neck rigidity or muscular tenderness.  Cardiovascular:     Rate and Rhythm: Normal rate and regular rhythm.     Heart sounds: No murmur. No friction rub. No gallop.   Pulmonary:     Effort: Pulmonary effort is normal. No respiratory distress.     Breath sounds: No stridor. No wheezing, rhonchi or rales.  Abdominal:     General: Bowel sounds are normal. There is no distension.     Palpations: Abdomen is soft. There is no mass.     Tenderness: There is abdominal tenderness. There is guarding. There is no right CVA  tenderness, left CVA tenderness or rebound.     Hernia: No hernia is present.     Comments: There is a well-healing vertical incision to the midline of the lower abdomen.  Ecchymosis is present at the caudal portion of the wound.  No erythema, warmth, edema, or discharge is noted.  Tender to palpation to the bilateral lower abdomen.  No rebound or guarding.  Abdomen is soft, nondistended.  Normoactive bowel sounds in all 4 quadrants.  Minimal tenderness in the bilateral upper quadrants.  No tenderness over McBurney's point.  Negative Murphy sign.  No CVA tenderness bilaterally.  Musculoskeletal:        General: No tenderness.     Right lower leg: No edema.  Left lower leg: No edema.  Lymphadenopathy:     Cervical: No cervical adenopathy.  Skin:    General: Skin is warm.     Findings: No rash.  Neurological:     Mental Status: She is alert.  Psychiatric:        Behavior: Behavior normal.      ED Treatments / Results  Labs (all labs ordered are listed, but only abnormal results are displayed) Labs Reviewed  BASIC METABOLIC PANEL - Abnormal; Notable for the following components:      Result Value   Glucose, Bld 108 (*)    Calcium 8.3 (*)    All other components within normal limits  CBC - Abnormal; Notable for the following components:   WBC 11.8 (*)    RBC 2.92 (*)    Hemoglobin 7.9 (*)    HCT 25.5 (*)    All other components within normal limits  URINALYSIS, ROUTINE W REFLEX MICROSCOPIC - Abnormal; Notable for the following components:   Color, Urine STRAW (*)    Hgb urine dipstick MODERATE (*)    Ketones, ur 5 (*)    Leukocytes, UA TRACE (*)    Bacteria, UA RARE (*)    All other components within normal limits  DIFFERENTIAL - Abnormal; Notable for the following components:   Eosinophils Absolute 1.1 (*)    All other components within normal limits  CBG MONITORING, ED  I-STAT BETA HCG BLOOD, ED ([facility], [facility], [facility] ONLY)    EKG EKG Interpretation  Date/Time:  Thursday  March 15 2018 04:49:17 EST Ventricular Rate:  84 PR Interval:    QRS Duration: 97 QT Interval:  372 QTC Calculation: 440 R Axis:   35 Text Interpretation:  Sinus rhythm Baseline wander in lead(s) V2 V5 No significant change was found Confirmed by Molpus, John ([id number]) on 03/15/2018 5:00:49 AM   Radiology No results found.  Procedures Procedures (including critical care time)  Medications Ordered in ED Medications - No data to display   Initial Impression / Assessment and Plan / ED Course  I have reviewed the triage vital signs and the nursing notes.  Pertinent labs & imaging results that were available during my care of the patient were reviewed by me and considered in my medical decision making (see chart for details).     47 year old female with a history of anemia, LUTS, BV, and chronic tension-type headache presenting on postop day 4 from a total hysterectomy performed at [facility].  She was discharged late yesterday evening and had an episode of shortness of breath that awoke her from sleep at 2 AM.  Spanish interpreter was used throughout the entire encounter.  She is asymptomatic at this time.  Her husband spoke to the on-call OB/GYN provider at [facility] who advised the patient to come to the ED for further work-up and evaluation.  The patient was discussed with Dr. Read Drivers, attending physician.  The patient's hemoglobin dropped from 13-7.2 after surgery.  Per chart review, the patient was offered a blood transfusion prior to discharge at [facility], but declined.  Repeat hemoglobin today is 7.9.  She denies any active bleeding.  She also reports 9 out of 10 abdominal pain, but only with positional changes and movement.  She has not been taking the prescription of oxycodone she was given after her surgery because it causes her to have nausea and vomiting.  I question if her episode of shortness of breath occurred while she was sleeping and  changed positions as it  rapidly resolved and she has had no additional episodes of shortness of breath since.  On my exam, the patient is laying with the head of the bed elevated to approximately 5 to 10 degrees and has no increased work of breathing and has an SaO2 of 100% with good waveform on room air.  She denies chest pain.  She has no tachycardia.  Low suspicion for PE.  She has a mild leukocytosis of 11.8 and is afebrile.  Doubt sepsis.  She has had 2 bowel movements since the procedure.  Doubt ileus.  I also have a low suspicion that the patient's episode of shortness of breath was secondary to symptom medic anemia as the patient has been ambulatory without dyspnea, fatigue, dizziness, lightheadedness and hemoglobin is slowly uptrending.  Consulted [facility] and spoke with Dr. Cicero Duck.  States that she is agreeable to giving the patient 1 unit of packed RBCs versus discharging her home with iron supplementation after shared decision making conversation.  The patient's husband reports that they were not given a prescription of ferrous sulfate at discharge and is requesting a prescription now.  I will also to discharge her home with a prescription of Zofran to ensure that her pain is adequately controlled.  After a shared decision making conversation, the patient and her husband declined blood transfusion.  We had a lengthy discussion regarding signs and symptoms that should warrant their return to the ER.  She is hemodynamically stable and in no acute distress.  She is safe for discharge home with outpatient follow-up at this time.  Final Clinical Impressions(s) / ED Diagnoses   Final diagnoses:  Postoperative anemia due to acute blood loss    ED Discharge Orders         Ordered    ondansetron (ZOFRAN) 4 MG tablet  Every 6 hours     03/15/18 0734    ferrous sulfate 325 (65 FE) MG tablet  2 times daily with meals     03/15/18 0734           Creedence Heiss A, PA-C 03/15/18 0752    Molpus, John, MD 03/15/18  2255

## 2018-03-21 ENCOUNTER — Telehealth: Payer: Self-pay | Admitting: *Deleted

## 2018-03-21 NOTE — Telephone Encounter (Signed)
Called and spoke with the husband, advised him that the appt for Friday has be canceled.

## 2018-03-23 ENCOUNTER — Ambulatory Visit: Payer: Self-pay | Admitting: Obstetrics

## 2018-08-28 ENCOUNTER — Encounter: Payer: Self-pay | Admitting: Emergency Medicine

## 2018-08-28 ENCOUNTER — Other Ambulatory Visit: Payer: Self-pay

## 2018-08-28 ENCOUNTER — Ambulatory Visit: Payer: Self-pay | Admitting: Emergency Medicine

## 2018-08-28 VITALS — BP 139/80 | HR 91 | Temp 99.2°F | Resp 18 | Ht 63.0 in | Wt 143.8 lb

## 2018-08-28 DIAGNOSIS — R300 Dysuria: Secondary | ICD-10-CM

## 2018-08-28 DIAGNOSIS — N390 Urinary tract infection, site not specified: Secondary | ICD-10-CM

## 2018-08-28 LAB — POCT URINALYSIS DIP (MANUAL ENTRY)
Bilirubin, UA: NEGATIVE
Glucose, UA: NEGATIVE mg/dL
Ketones, POC UA: NEGATIVE mg/dL
Nitrite, UA: NEGATIVE
Protein Ur, POC: 30 mg/dL — AB
Spec Grav, UA: 1.02 (ref 1.010–1.025)
Urobilinogen, UA: 0.2 E.U./dL
pH, UA: 6.5 (ref 5.0–8.0)

## 2018-08-28 LAB — POC MICROSCOPIC URINALYSIS (UMFC): Mucus: ABSENT

## 2018-08-28 MED ORDER — CIPROFLOXACIN HCL 500 MG PO TABS: 500.0000 mg | ORAL_TABLET | Freq: Two times a day (BID) | ORAL | 2 refills | Status: AC

## 2018-08-28 NOTE — Progress Notes (Signed)
Alexandra Barnes 48 y.o.   Chief Complaint  Patient presents with  . Dysuria    hurts to pee 2-3weeks and hurts during sex     HISTORY OF PRESENT ILLNESS: This is a 48 y.o. female complaining of burning on urination for the past 2 to 3 weeks.  Status post total abdominal hysterectomy last December 2019.  Negative for cancer.  Has been doing well up until 2 to 3 weeks ago.  Has a history of UTIs in the past with cultures growing E. coli sensitive to all antibiotics except ampicillin.  Denies fever or chills.  Denies nausea, vomiting, flank pain, or any other significant symptoms.  HPI   Prior to Admission medications   Medication Sig Start Date End Date Taking? Authorizing Provider  acetaminophen (TYLENOL) 500 MG tablet Take 500 mg by mouth every 6 (six) hours as needed for mild pain.    [provider]  ciprofloxacin (CIPRO) 500 MG tablet Take 1 tablet (500 mg total) by mouth 2 (two) times daily for 7 days. 08/28/18 09/04/18  Georgina Quint, MD  ferrous sulfate 325 (65 FE) MG tablet Take 1 tablet (325 mg total) by mouth 2 (two) times daily with a meal. 03/15/18 04/14/18  McDonald, Mia A, PA-C  ibuprofen (ADVIL,MOTRIN) 200 MG tablet Take 200 mg by mouth every 6 (six) hours as needed for moderate pain.    [provider]  ondansetron (ZOFRAN) 4 MG tablet Take 1 tablet (4 mg total) by mouth every 6 (six) hours. Patient not taking: Reported on 08/28/2018 03/15/18   Frederik Pear A, PA-C    No Known Allergies  Patient Active Problem List   Diagnosis Date Noted  . History of UTI 01/06/2017  . Hospital discharge follow-up 01/06/2017  . Lower urinary tract symptoms (LUTS) 01/06/2017  . Bacterial vaginosis 01/06/2017  . Dizziness 09/26/2016  . Chronic tension-type headache, not intractable 09/26/2016    Past Medical History:  Diagnosis Date  . History of anemia     Past Surgical History:  Procedure Laterality Date  . TUBAL LIGATION      Social History    Socioeconomic History  . Marital status: Married    Spouse name: Not on file  . Number of children: 4  . Years of education: Not on file  . Highest education level: Not on file  Occupational History  . Not on file  Social Needs  . Financial resource strain: Not on file  . Food insecurity:    Worry: Not on file    Inability: Not on file  . Transportation needs:    Medical: Not on file    Non-medical: Not on file  Tobacco Use  . Smoking status: Never Smoker  . Smokeless tobacco: Never Used  Substance and Sexual Activity  . Alcohol use: No  . Drug use: No  . Sexual activity: Yes  Lifestyle  . Physical activity:    Days per week: Not on file    Minutes per session: Not on file  . Stress: Not on file  Relationships  . Social connections:    Talks on phone: Not on file    Gets together: Not on file    Attends religious service: Not on file    Active member of club or organization: Not on file    Attends meetings of clubs or organizations: Not on file    Relationship status: Not on file  . Intimate partner violence:    Fear of current or ex partner:  Not on file    Emotionally abused: Not on file    Physically abused: Not on file    Forced sexual activity: Not on file  Other Topics Concern  . Not on file  Social History Narrative  . Not on file    Family History  Problem Relation Age of Onset  . Colon cancer Sister 24     Review of Systems  Constitutional: Negative.  Negative for chills, fever and malaise/fatigue.  HENT: Negative.  Negative for congestion and sore throat.   Eyes: Negative.   Respiratory: Negative.  Negative for cough and shortness of breath.   Cardiovascular: Negative.  Negative for chest pain and palpitations.  Gastrointestinal: Negative.  Negative for abdominal pain, blood in stool, diarrhea, nausea and vomiting.  Genitourinary: Positive for dysuria, frequency, hematuria and urgency. Negative for flank pain.  Musculoskeletal: Negative for back  pain and myalgias.  Skin: Negative.  Negative for rash.  Neurological: Negative for dizziness and headaches.  Endo/Heme/Allergies: Negative.   All other systems reviewed and are negative.  Vitals:   08/28/18 1107  BP: 139/80  Pulse: 91  Resp: 18  Temp: 99.2 F (37.3 C)  SpO2: 97%     Physical Exam Vitals signs reviewed.  Constitutional:      Appearance: Normal appearance.  HENT:     Head: Normocephalic and atraumatic.  Eyes:     Extraocular Movements: Extraocular movements intact.     Conjunctiva/sclera: Conjunctivae normal.     Pupils: Pupils are equal, round, and reactive to light.  Neck:     Musculoskeletal: Normal range of motion and neck supple.  Cardiovascular:     Rate and Rhythm: Normal rate and regular rhythm.     Heart sounds: Normal heart sounds.  Pulmonary:     Effort: Pulmonary effort is normal.     Breath sounds: Normal breath sounds.  Abdominal:     General: Bowel sounds are normal. There is no distension.     Palpations: Abdomen is soft.     Tenderness: There is no abdominal tenderness. There is no right CVA tenderness or left CVA tenderness.  Musculoskeletal: Normal range of motion.  Skin:    General: Skin is warm and dry.     Capillary Refill: Capillary refill takes less than 2 seconds.  Neurological:     General: No focal deficit present.     Mental Status: She is alert and oriented to person, place, and time.  Psychiatric:        Mood and Affect: Mood normal.        Behavior: Behavior normal.    Results for orders placed or performed in visit on 08/28/18 (from the past 24 hour(s))  POCT urinalysis dipstick     Status: Abnormal   Collection Time: 08/28/18 11:28 AM  Result Value Ref Range   Color, UA yellow yellow   Clarity, UA cloudy (A) clear   Glucose, UA negative negative mg/dL   Bilirubin, UA negative negative   Ketones, POC UA negative negative mg/dL   Spec Grav, UA 7.680 8.811 - 1.025   Blood, UA large (A) negative   pH, UA 6.5  5.0 - 8.0   Protein Ur, POC =30 (A) negative mg/dL   Urobilinogen, UA 0.2 0.2 or 1.0 E.U./dL   Nitrite, UA Negative Negative   Leukocytes, UA Large (3+) (A) Negative  POCT Microscopic Urinalysis ([facility])     Status: Abnormal   Collection Time: 08/28/18 11:43 AM  Result Value Ref Range  WBC,UR,HPF,POC Many (A) None WBC/hpf   RBC,UR,HPF,POC Moderate (A) None RBC/hpf   Bacteria Many (A) None, Too numerous to count   Mucus Absent Absent   Epithelial Cells, UR Per Microscopy Few (A) None, Too numerous to count cells/hpf     ASSESSMENT & PLAN: Alexandra Barnes was seen today for dysuria.  Diagnoses and all orders for this visit:  Dysuria -     POCT urinalysis dipstick -     POCT Microscopic Urinalysis ([facility]) -     Urine Culture  Acute UTI -     ciprofloxacin (CIPRO) 500 MG tablet; Take 1 tablet (500 mg total) by mouth 2 (two) times daily for 7 days.    Patient Instructions       If you have lab work done today you will be contacted with your lab results within the next 2 weeks.  If you have not heard from [place] then please contact us. The fastest way to get your results is to register for My Chart.   IF you received an x-ray today, you will receive an invoice from [facility]. Please contact [facility] at [phone] with questions or concerns regarding your invoice.   IF you received labwork today, you will receive an invoice from Center. Please contact [facility] at [phone] with questions or concerns regarding your invoice.   Our billing staff will not be able to assist you with questions regarding bills from these companies.  You will be contacted with the lab results as soon as they are available. The fastest way to get your results is to activate your My Chart account. Instructions are located on the last page of this paperwork. If you have not heard from [place] regarding the results in 2 weeks, please contact this office.     Infeccin urinaria en los adultos  Urinary Tract Infection, Adult Una infeccin urinaria (IU) puede ocurrir en Corporate treasurer de las vas [place]. Las vas urinarias incluyen lo siguiente:  Los riones.  Los urteres.  La vejiga.  La uretra. Estos rganos fabrican, almacenan y eliminan el pis (orina) del cuerpo. Cules son las causas? La causa es la presencia de grmenes (bacterias) en la zona genital. Estos grmenes proliferan y causan hinchazn (inflamacin) de las vas urinarias. Qu incrementa el riesgo? Es ms probable que contraiga esta afeccin si:  Tiene colocado un tubo delgado y pequeo (catter) para drenar el pis.  Nopuede controlar la evacuacin de pis ni de materia fecal (incontinencia).  Es Nurse, learning disability y, adems: ? [place] estos mtodos para Location manager: ? Un medicamento que [employer] espermatozoides (espermicida). ? Un dispositivo que impide el paso de los espermatozoides (diafragma). ? Tieneniveles bajos de una hormona femenina (estrgeno). ? Est embarazada.  Tiene genes que [employer].  Es sexualmente activa.  Toma antibiticos.  Tiene dificultad para orinar debido a: ? Su prstata es ms grande de lo normal, si usted es hombre. ? Obstruccin en la parte del cuerpo que drena el pis de la vejiga (uretra). ? Clculo renal. ? Untrastorno nervioso que afecta la vejiga (vejiga neurgena). ? No bebe una cantidad suficiente de lquido. ? No hace pis con la frecuencia suficiente.  Tiene otras afecciones, como: ? Diabetes. ? Un sistema que combate las enfermedades (sistema inmunitario) debilitado. ? Anemia drepanoctica. ? Gota. ? Lesin en la columna vertebral. Cules son los signos o los sntomas? Los sntomas de esta afeccin incluyen:  Necesidad inmediata (urgente) de hacer pis.  Hacer pis con frecuencia.  Hacer poca cantidad  de pis con mucha frecuencia.  Dolor o ardor al American Standard Companieshacer pis.  Sangre en el pis.  Pis que huele mal o anormal.  Dificultad para hacer pis.   Pis turbio.  Lquido que sale de la vagina, si es [place].  Dolor en la barriga o en la parte baja de la espalda. Otros sntomas pueden incluir los siguientes:  Ganas de Control and instrumentation engineerdevolver (vmitos).  No sentir deseos de comer.  Sentirse confundido (confuso).  Sentirse cansado y malhumorado (irritable).  Grant RutsFiebre.  Materia fecal lquida (diarrea). Cmo se trata? El tratamiento de esta afeccin puede incluir:  Antibiticos.  Otros medicamentos.  Beber una cantidad suficiente de agua. Siga estas indicaciones en su casa:  Medicamentos  Baxter Internationalome los medicamentos de venta libre y los recetados solamente como se lo haya indicado el mdico.  Si le recetaron un antibitico, tmelo como se lo haya indicado el mdico. No deje de tomarlo aunque comience a sentirse mejor. Indicaciones generales  Asegrese de hacer lo siguiente: ? Haga pis hasta que la vejiga quede vaca. ? Nocontenga el pis durante mucho tiempo. ? Vace la vejiga despus de Management consultanttener relaciones sexuales. ? Lmpiese de adelante hacia atrs despus de defecar, si es mujer. Use cada trozo de papel higinico solo una vez cuando se limpie.  Beba suficiente lquido como para Pharmacologistmantener la orina de color amarillo plido.  Concurra a todas las visitas de control como se lo haya indicado el mdico. Esto es importante. Comunquese con un mdico si:  No mejora despus de 1 o 2das de tratamiento.  Los sntomas desaparecen y Stage managerluego reaparecen. Solicite ayuda inmediatamente si:  Tiene un dolor muy intenso en la espalda.  Tiene dolor muy intenso en la parte baja de la barriga.  Tiene fiebre.  Tiene Programme researcher, broadcasting/film/videomalestar estomacal (nuseas).  Tiene vmitos. Resumen  Una infeccin urinaria (IU) puede ocurrir en Corporate treasurercualquier lugar de las vas Simmesporturinarias.  Esta afeccin es causada por la presencia de grmenes en la zona genital.  Existen muchos factores de riesgo de sufrir una IU. Estos incluyen tener colocado un tubo delgado y pequeo para drenar el  pis y no poder controlar cundo hace pis y materia fecal.  El tratamiento incluye antibiticos contra los grmenes.  Beba suficiente lquido como para Pharmacologistmantener la orina de color amarillo plido. Esta informacin no tiene Theme park managercomo fin reemplazar el consejo del mdico. Asegrese de hacerle al mdico cualquier pregunta que tenga. Document Released: 09/08/2009 Document Revised: 11/22/2017 Document Reviewed: 11/22/2017 Elsevier Interactive Patient Education  2019 Elsevier Inc.      Edwina BarthMiguel Cathleen Yagi, MD [facility]

## 2018-08-28 NOTE — Patient Instructions (Addendum)
If you have lab work done today you will be contacted with your lab results within the next 2 weeks.  If you have not heard from [place] then please contact us. The fastest way to get your results is to register for My Chart.   IF you received an x-ray today, you will receive an invoice from [facility] Radiology. Please contact [facility] Radiology at [phone] with questions or concerns regarding your invoice.   IF you received labwork today, you will receive an invoice from [place]. Please contact [facility] at [phone] with questions or concerns regarding your invoice.   Our billing staff will not be able to assist you with questions regarding bills from these companies.  You will be contacted with the lab results as soon as they are available. The fastest way to get your results is to activate your My Chart account. Instructions are located on the last page of this paperwork. If you have not heard from [place] regarding the results in 2 weeks, please contact this office.     Infeccin urinaria en los adultos Urinary Tract Infection, Adult Una infeccin urinaria (IU) puede ocurrir en Corporate treasurer de las vas [place]. Las vas urinarias incluyen lo siguiente:  Los riones.  Los urteres.  La vejiga.  La uretra. Estos rganos fabrican, almacenan y eliminan el pis (orina) del cuerpo. Cules son las causas? La causa es la presencia de grmenes (bacterias) en la zona genital. Estos grmenes proliferan y causan hinchazn (inflamacin) de las vas urinarias. Qu incrementa el riesgo? Es ms probable que contraiga esta afeccin si:  Tiene colocado un tubo delgado y pequeo (catter) para drenar el pis.  Nopuede controlar la evacuacin de pis ni de materia fecal (incontinencia).  Es Nurse, learning disability y, adems: ? [place] estos mtodos para Location manager: ? Un medicamento que [employer] espermatozoides (espermicida). ? Un dispositivo que impide el paso de los espermatozoides  (diafragma). ? Tieneniveles bajos de una hormona femenina (estrgeno). ? Est embarazada.  Tiene genes que [employer].  Es sexualmente activa.  Toma antibiticos.  Tiene dificultad para orinar debido a: ? Su prstata es ms grande de lo normal, si usted es hombre. ? Obstruccin en la parte del cuerpo que drena el pis de la vejiga (uretra). ? Clculo renal. ? Untrastorno nervioso que afecta la vejiga (vejiga neurgena). ? No bebe una cantidad suficiente de lquido. ? No hace pis con la frecuencia suficiente.  Tiene otras afecciones, como: ? Diabetes. ? Un sistema que combate las enfermedades (sistema inmunitario) debilitado. ? Anemia drepanoctica. ? Gota. ? Lesin en la columna vertebral. Cules son los signos o los sntomas? Los sntomas de esta afeccin incluyen:  Necesidad inmediata (urgente) de hacer pis.  Hacer pis con frecuencia.  Hacer poca cantidad de pis con mucha frecuencia.  Dolor o ardor al [employer].  Sangre en el pis.  Pis que huele mal o anormal.  Dificultad para hacer pis.  Pis turbio.  Lquido que sale de la vagina, si es [place].  Dolor en la barriga o en la parte baja de la espalda. Otros sntomas pueden incluir los siguientes:  Ganas de Control and instrumentation engineer (vmitos).  No sentir deseos de comer.  Sentirse confundido (confuso).  Sentirse cansado y malhumorado (irritable).  Grant Ruts.  Materia fecal lquida (diarrea). Cmo se trata? El tratamiento de esta afeccin puede incluir:  Antibiticos.  Otros medicamentos.  Beber una cantidad suficiente de agua. Siga estas indicaciones en su casa:  [employer] de venta  libre y los recetados solamente como se lo haya indicado el mdico.  Si le recetaron un antibitico, tmelo como se lo haya indicado el mdico. No deje de tomarlo aunque comience a sentirse mejor. Indicaciones generales  Asegrese de hacer lo siguiente: ? Haga pis hasta que la vejiga quede  vaca. ? Nocontenga el pis durante mucho tiempo. ? Vace la vejiga despus de Management consultanttener relaciones sexuales. ? Lmpiese de adelante hacia atrs despus de defecar, si es mujer. Use cada trozo de papel higinico solo una vez cuando se limpie.  Beba suficiente lquido como para Pharmacologistmantener la orina de color amarillo plido.  Concurra a todas las visitas de control como se lo haya indicado el mdico. Esto es importante. Comunquese con un mdico si:  No mejora despus de 1 o 2das de tratamiento.  Los sntomas desaparecen y Stage managerluego reaparecen. Solicite ayuda inmediatamente si:  Tiene un dolor muy intenso en la espalda.  Tiene dolor muy intenso en la parte baja de la barriga.  Tiene fiebre.  Tiene Programme researcher, broadcasting/film/videomalestar estomacal (nuseas).  Tiene vmitos. Resumen  Una infeccin urinaria (IU) puede ocurrir en Corporate treasurercualquier lugar de las vas [place].  Esta afeccin es causada por la presencia de grmenes en la zona genital.  Existen muchos factores de riesgo de sufrir una IU. Estos incluyen tener colocado un tubo delgado y pequeo para drenar el pis y no poder controlar cundo hace pis y materia fecal.  El tratamiento incluye antibiticos contra los grmenes.  Beba suficiente lquido como para Pharmacologistmantener la orina de color amarillo plido. Esta informacin no tiene Theme park managercomo fin reemplazar el consejo del mdico. Asegrese de hacerle al mdico cualquier pregunta que tenga. Document Released: 09/08/2009 Document Revised: 11/22/2017 Document Reviewed: 11/22/2017 Elsevier Interactive Patient Education  2019 [employer]

## 2018-08-30 ENCOUNTER — Telehealth: Payer: Self-pay | Admitting: Emergency Medicine

## 2018-08-30 LAB — URINE CULTURE

## 2018-08-30 NOTE — Telephone Encounter (Signed)
Spoke to husband and wife about the urine culture results.  Patient is doing better.  Taking Cipro 500 mg twice a day.  No concerns.

## 2019-06-10 ENCOUNTER — Encounter: Payer: Self-pay | Admitting: Emergency Medicine

## 2019-06-10 ENCOUNTER — Ambulatory Visit (INDEPENDENT_AMBULATORY_CARE_PROVIDER_SITE_OTHER): Payer: Self-pay | Admitting: Emergency Medicine

## 2019-06-10 ENCOUNTER — Other Ambulatory Visit: Payer: Self-pay

## 2019-06-10 VITALS — BP 145/76 | HR 69 | Temp 98.9°F | Resp 16 | Ht 63.0 in | Wt 150.0 lb

## 2019-06-10 DIAGNOSIS — R102 Pelvic and perineal pain: Secondary | ICD-10-CM

## 2019-06-10 DIAGNOSIS — M25552 Pain in left hip: Secondary | ICD-10-CM

## 2019-06-10 DIAGNOSIS — Z90710 Acquired absence of both cervix and uterus: Secondary | ICD-10-CM

## 2019-06-10 DIAGNOSIS — S76212A Strain of adductor muscle, fascia and tendon of left thigh, initial encounter: Secondary | ICD-10-CM

## 2019-06-10 NOTE — Patient Instructions (Addendum)
  Take Tylenol as needed for pain.   If you have lab work done today you will be contacted with your lab results within the next 2 weeks.  If you have not heard from [place] then please contact us. The fastest way to get your results is to register for My Chart.   IF you received an x-ray today, you will receive an invoice from [facility] Radiology. Please contact [facility] Radiology at [phone] with questions or concerns regarding your invoice.   IF you received labwork today, you will receive an invoice from [facility]. Please contact [facility] at [phone] with questions or concerns regarding your invoice.   Our billing staff will not be able to assist you with questions regarding bills from these companies.  You will be contacted with the lab results as soon as they are available. The fastest way to get your results is to activate your My Chart account. Instructions are located on the last page of this paperwork. If you have not heard from [place] regarding the results in 2 weeks, please contact this office.     Dolor plvico en la mujer Pelvic Pain, Female El dolor plvico se siente en la parte inferior del vientre (abdomen), debajo del ombligo y a nivel de las caderas. El dolor puede comenzar en forma repentina (ser [employer]), reaparecer (ser recurrente) o durar mucho tiempo (volverse crnico). El dolor plvico que dura ms de 6 meses se denomina dolor plvico crnico. El dolor plvico puede tener muchas causas. A veces, la causa del dolor plvico no se conoce. Siga estas indicaciones en su casa:   Tome los medicamentos de venta libre y los recetados solamente como se lo haya indicado el mdico.  Haga reposo como se lo haya indicado el mdico.  No tenga relaciones sexuales si siente dolor.  Lleve un registro del dolor plvico. Escriba los siguientes datos: ? Cundo Radiation protection practitioner. ? La ubicacin del dolor. ? Qu parece mejorar o empeorar el dolor, como alimentos o el perodo (ciclo  menstrual). ? Cualquier sntoma que se presente junto con Chief Technology Officer.  Concurra a todas las visitas de control como se lo haya indicado el mdico. Esto es importante. Comunquese con un mdico si:  Los medicamentos no Tourist information centre manager.  El dolor regresa.  Aparecen nuevos sntomas.  Tiene sangrado o secrecin inusual de la vagina.  Tiene fiebre o escalofros.  Tiene dificultad para defecar (estreimiento).  Observa sangre en el pis (orina) o en la materia fecal (heces).  El pis tiene mal olor.  Se siente dbil o siente que va a desvanecerse. Solicite ayuda inmediatamente si:  Freight forwarder repentino y Optometrist.  El dolor es cada Transport planner.  Siente un dolor muy intenso y tambin tiene alguno de estos sntomas: ? Grant Ruts. ? Malestar estomacal (nuseas). ? Vmitos. ? Tiene mucho sudor.  Se desmaya (pierde el conocimiento). Resumen  El dolor plvico se siente en la parte inferior del vientre (abdomen), debajo del ombligo y a nivel de las caderas.  Hay muchas causas posibles del dolor plvico.  Lleve un registro del dolor plvico. Esta informacin no tiene Theme park manager el consejo del mdico. Asegrese de hacerle al mdico cualquier pregunta que tenga. Document Revised: 11/09/2017 Document Reviewed: 11/09/2017 Elsevier Patient Education  2020 [employer].

## 2019-06-10 NOTE — Progress Notes (Signed)
Alexandra Barnes 49 y.o.   No chief complaint on file.   HISTORY OF PRESENT ILLNESS: This is a 49 y.o. female complaining of left-sided pelvic pain for 3 weeks. Status post total abdominal hysterectomy last December in [facility]. Slipped and pulled left inguinal area 3 weeks ago prior to symptoms. Works 8 to 10-hour shifts, mostly standing. Denies systemic symptoms.  Localized pain that waxes and wanes, dull with some radiation to thigh area. Still able to eat and drink.  Denies nausea or vomiting.  Denies abdominal pain diarrhea or constipation.  Denies fever or chills. Denies urinary symptoms. No other complaints or medical concerns today.  HPI   Prior to Admission medications   Medication Sig Start Date End Date Taking? Authorizing Provider  acetaminophen (TYLENOL) 500 MG tablet Take 500 mg by mouth every 6 (six) hours as needed for mild pain.    [provider]  ferrous sulfate 325 (65 FE) MG tablet Take 1 tablet (325 mg total) by mouth 2 (two) times daily with a meal. 03/15/18 04/14/18  McDonald, Mia A, PA-C  ibuprofen (ADVIL,MOTRIN) 200 MG tablet Take 200 mg by mouth every 6 (six) hours as needed for moderate pain.    [provider]  ondansetron (ZOFRAN) 4 MG tablet Take 1 tablet (4 mg total) by mouth every 6 (six) hours. Patient not taking: Reported on 06/10/2019 03/15/18   McDonald, Pedro Earls A, PA-C    No Known Allergies  Patient Active Problem List   Diagnosis Date Noted  . Chronic tension-type headache, not intractable 09/26/2016    Past Medical History:  Diagnosis Date  . History of anemia     Past Surgical History:  Procedure Laterality Date  . TUBAL LIGATION      Social History   Socioeconomic History  . Marital status: Married    Spouse name: Not on file  . Number of children: 4  . Years of education: Not on file  . Highest education level: Not on file  Occupational History  . Not on file  Tobacco Use  . Smoking  status: Never Smoker  . Smokeless tobacco: Never Used  Substance and Sexual Activity  . Alcohol use: No  . Drug use: No  . Sexual activity: Yes  Other Topics Concern  . Not on file  Social History Narrative  . Not on file   Social Determinants of Health   Financial Resource Strain:   . Difficulty of Paying Living Expenses: Not on file  Food Insecurity:   . Worried About Programme researcher, broadcasting/film/video in the Last Year: Not on file  . Ran Out of Food in the Last Year: Not on file  Transportation Needs:   . Lack of Transportation (Medical): Not on file  . Lack of Transportation (Non-Medical): Not on file  Physical Activity:   . Days of Exercise per Week: Not on file  . Minutes of Exercise per Session: Not on file  Stress:   . Feeling of Stress : Not on file  Social Connections:   . Frequency of Communication with Friends and Family: Not on file  . Frequency of Social Gatherings with Friends and Family: Not on file  . Attends Religious Services: Not on file  . Active Member of Clubs or Organizations: Not on file  . Attends Banker Meetings: Not on file  . Marital Status: Not on file  Intimate Partner Violence:   . Fear of Current or Ex-Partner: Not on file  . Emotionally  Abused: Not on file  . Physically Abused: Not on file  . Sexually Abused: Not on file    Family History  Problem Relation Age of Onset  . Colon cancer Sister 24     Review of Systems  Constitutional: Negative.  Negative for chills and fever.  HENT: Negative.  Negative for congestion and sore throat.   Respiratory: Negative.  Negative for cough and shortness of breath.   Cardiovascular: Negative.  Negative for chest pain and palpitations.  Gastrointestinal: Negative.  Negative for abdominal pain, diarrhea, nausea and vomiting.  Genitourinary: Negative for dysuria, flank pain and hematuria.       Vaginal discomfort during intercourse  Musculoskeletal: Negative.  Negative for back pain, myalgias and  neck pain.  Skin: Negative.  Negative for rash.  Neurological: Negative.  Negative for dizziness and headaches.  Endo/Heme/Allergies: Negative.   All other systems reviewed and are negative.  Today's Vitals   06/10/19 0936  BP: (!) 145/76  Pulse: 69  Resp: 16  Temp: 98.9 F (37.2 C)  TempSrc: Temporal  SpO2: 100%  Weight: 150 lb (68 kg)  Height: 5\' 3"  (1.6 m)   Body mass index is 26.57 kg/m.   Physical Exam Vitals reviewed.  Constitutional:      Appearance: Normal appearance.  HENT:     Head: Normocephalic.  Eyes:     Extraocular Movements: Extraocular movements intact.     Pupils: Pupils are equal, round, and reactive to light.  Cardiovascular:     Rate and Rhythm: Normal rate and regular rhythm.     Pulses: Normal pulses.     Heart sounds: Normal heart sounds.  Pulmonary:     Effort: Pulmonary effort is normal.     Breath sounds: Normal breath sounds.  Abdominal:     General: Bowel sounds are normal. There is no distension.     Palpations: Abdomen is soft. There is no mass.     Tenderness: There is no abdominal tenderness. There is no right CVA tenderness, left CVA tenderness or guarding.     Comments: Vertical recent surgical scar.  Healing well. Much smaller pelvic horizontal C-section scar, old.  Musculoskeletal:     Cervical back: Normal range of motion and neck supple.     Comments: Some tenderness to left inguinal area, complaining of pain during straight left leg elevation  Skin:    General: Skin is warm and dry.     Capillary Refill: Capillary refill takes less than 2 seconds.  Neurological:     General: No focal deficit present.     Mental Status: She is alert and oriented to person, place, and time.  Psychiatric:        Mood and Affect: Mood normal.        Behavior: Behavior normal.      ASSESSMENT & PLAN: Diagnoses and all orders for this visit:  Pelvic pain -     Ambulatory referral to Gynecology  Pain in joint involving left pelvic  region and thigh  Inguinal strain, left, initial encounter  History of abdominal hysterectomy Comments: December 2020 Orders: -     Ambulatory referral to Gynecology    Patient Instructions    Take Tylenol as needed for pain.   If you have lab work done today you will be contacted with your lab results within the next 2 weeks.  If you have not heard from January 2021 then please contact us. The fastest way to get your results is to register for  My Chart.   IF you received an x-ray today, you will receive an invoice from [facility] Radiology. Please contact [facility] Radiology at [phone] with questions or concerns regarding your invoice.   IF you received labwork today, you will receive an invoice from [place]. Please contact [facility] at [phone] with questions or concerns regarding your invoice.   Our billing staff will not be able to assist you with questions regarding bills from these companies.  You will be contacted with the lab results as soon as they are available. The fastest way to get your results is to activate your My Chart account. Instructions are located on the last page of this paperwork. If you have not heard from [place] regarding the results in 2 weeks, please contact this office.     Dolor plvico en la mujer Pelvic Pain, Female El dolor plvico se siente en la parte inferior del vientre (abdomen), debajo del ombligo y a nivel de las caderas. El dolor puede comenzar en forma repentina (ser [employer]), reaparecer (ser recurrente) o durar mucho tiempo (volverse crnico). El dolor plvico que dura ms de 6 meses se denomina dolor plvico crnico. El dolor plvico puede tener muchas causas. A veces, la causa del dolor plvico no se conoce. Siga estas indicaciones en su casa:   Tome los medicamentos de venta libre y los recetados solamente como se lo haya indicado el mdico.  Haga reposo como se lo haya indicado el mdico.  No tenga relaciones sexuales si siente dolor.   Lleve un registro del dolor plvico. Escriba los siguientes datos: ? Cundo Energy manager. ? La ubicacin del dolor. ? Qu parece mejorar o empeorar el dolor, como alimentos o el perodo (ciclo menstrual). ? Cualquier sntoma que se presente junto con Conservation officer, historic buildings.  Concurra a todas las visitas de control como se lo haya indicado el mdico. Esto es importante. Comunquese con un mdico si:  Los medicamentos no Forensic psychologist.  El dolor regresa.  Aparecen nuevos sntomas.  Tiene sangrado o secrecin inusual de la vagina.  Tiene fiebre o escalofros.  Tiene dificultad para defecar (estreimiento).  Observa sangre en el pis (orina) o en la materia fecal (heces).  El pis tiene mal olor.  Se siente dbil o siente que va a desvanecerse. Solicite ayuda inmediatamente si:  Technical brewer repentino y Wellsite geologist.  El dolor es cada Producer, television/film/video.  Siente un dolor muy intenso y tambin tiene alguno de estos sntomas: ? Cristy Hilts. ? Malestar estomacal (nuseas). ? Vmitos. ? Tiene mucho sudor.  Se desmaya (pierde el conocimiento). Resumen  El dolor plvico se siente en la parte inferior del vientre (abdomen), debajo del ombligo y a nivel de las caderas.  Hay muchas causas posibles del dolor plvico.  Lleve un registro del dolor plvico. Esta informacin no tiene Marine scientist el consejo del mdico. Asegrese de hacerle al mdico cualquier pregunta que tenga. Document Revised: 11/09/2017 Document Reviewed: 11/09/2017 Elsevier Patient Education  2020 Elsevier Inc.      Agustina Caroli, MD [facility]

## 2019-10-08 IMAGING — US US PELVIS COMPLETE TRANSABD/TRANSVAG
1 series · 13 of 25 positions shown · non-contrast
Comparison: 02/19/2018 CT abdomen and pelvis.

CLINICAL DATA: 47 y/o  F; pelvic mass.  Abdominal pain.



[Series 1: us pelvis complete transabd/transvag · 13 of 84 slices shown]
[im 1/84]
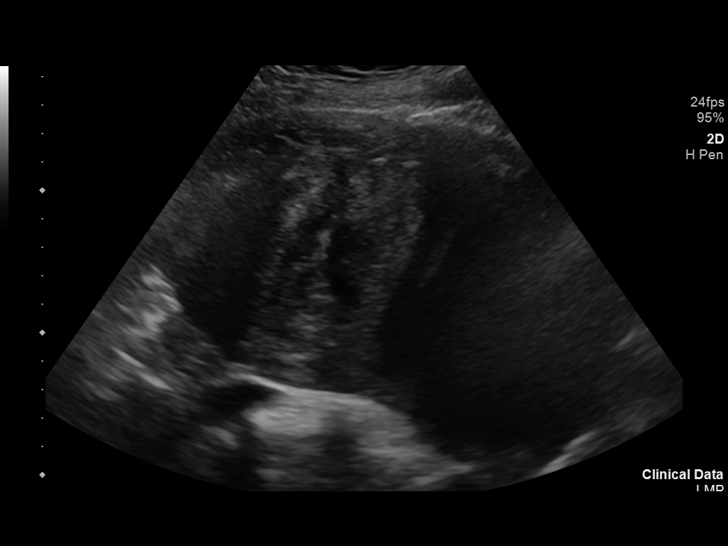
[im 7/84]
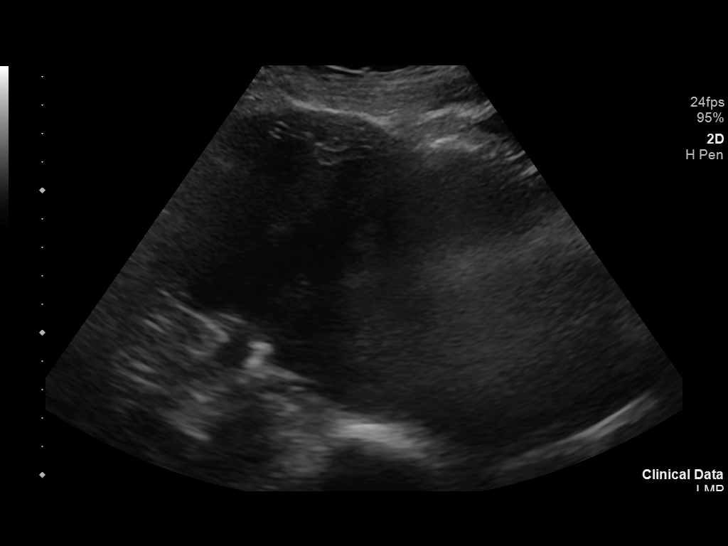
[im 14/84]
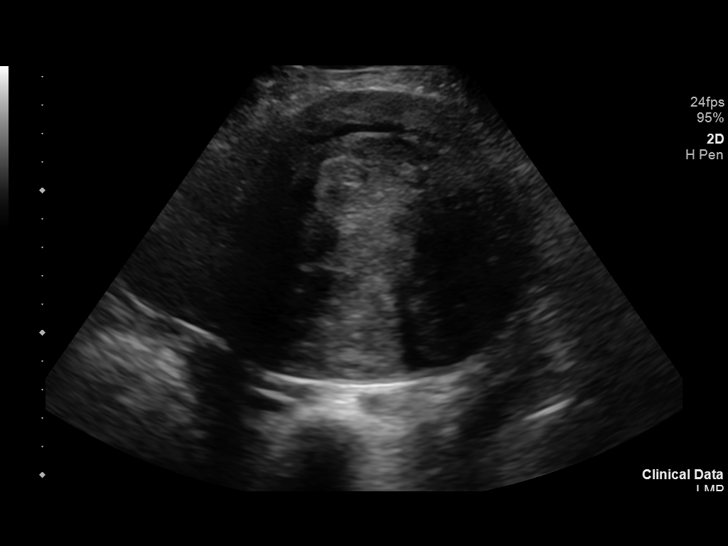
[im 21/84]
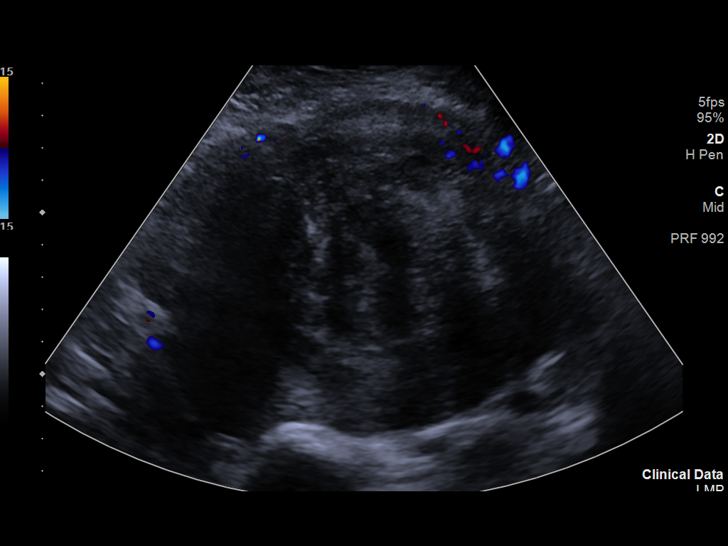
[im 28/84]
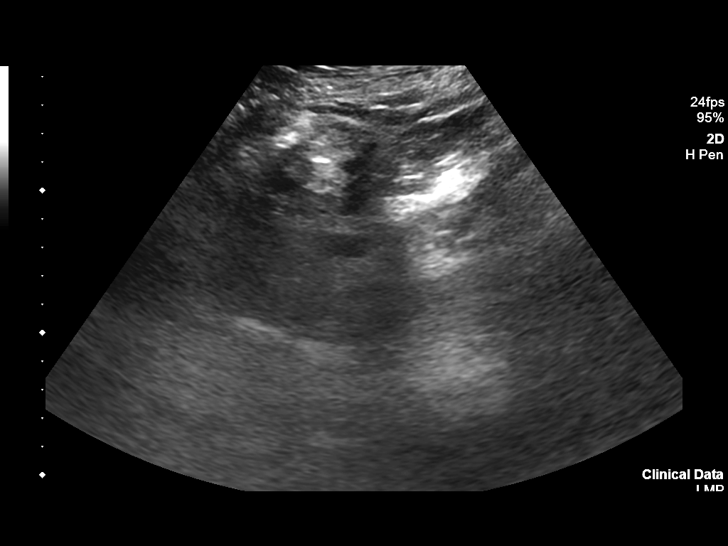
[im 35/84]
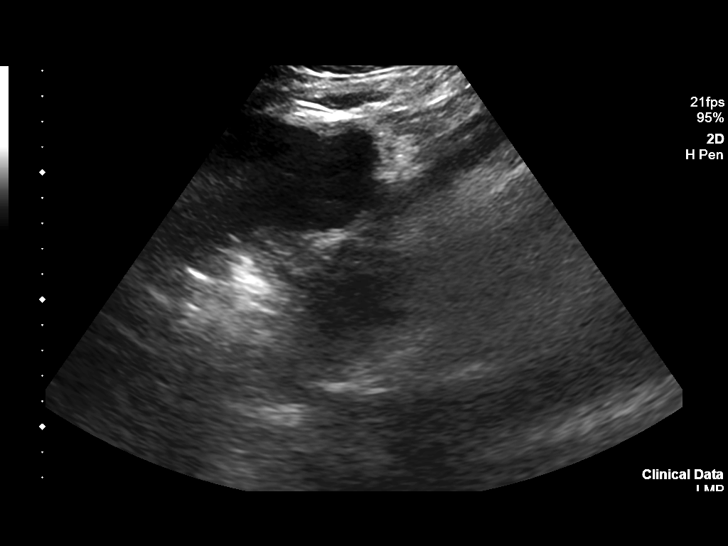
[im 42/84]
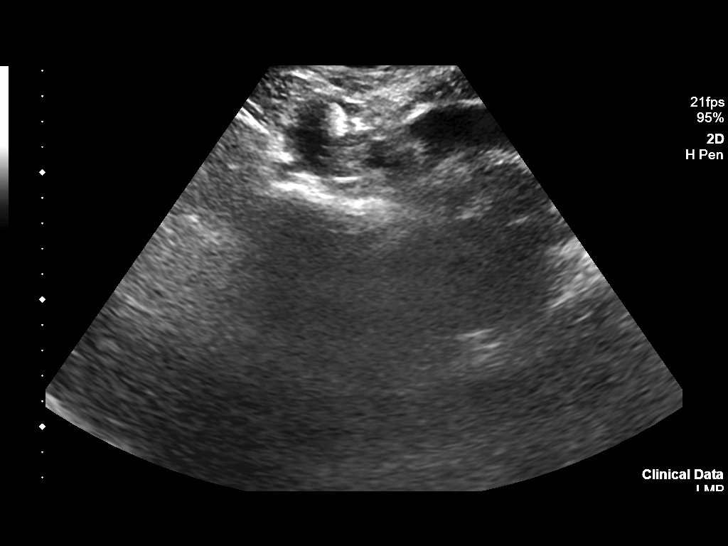
[im 49/84]
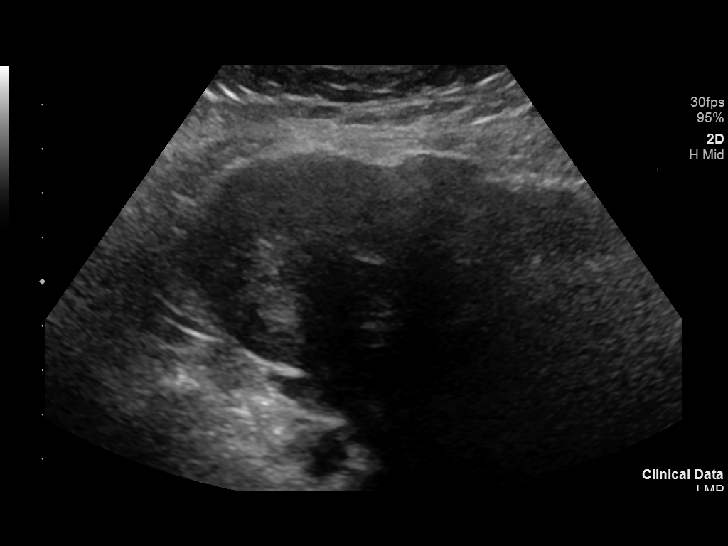
[im 56/84]
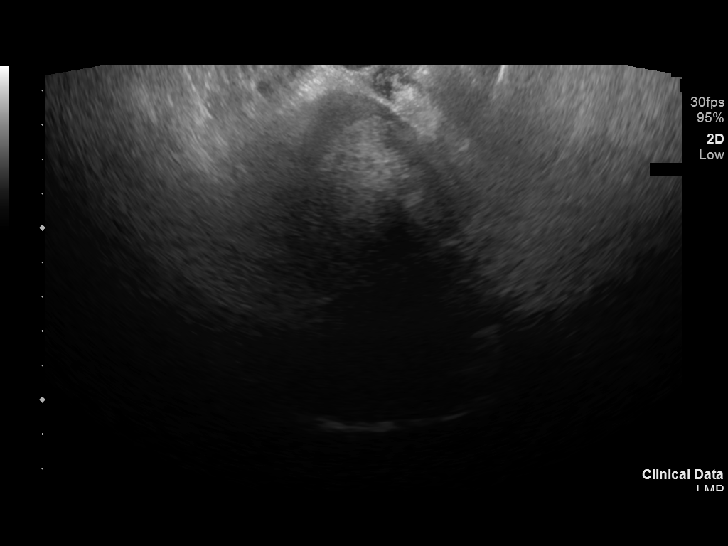
[im 63/84]
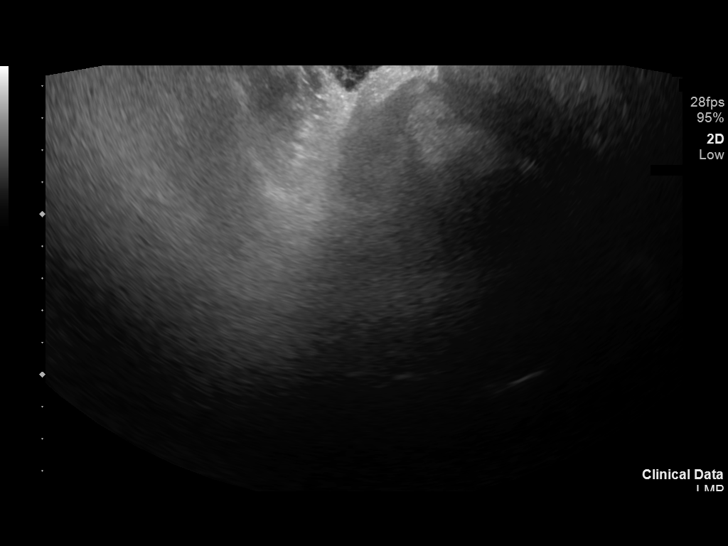
[im 70/84]
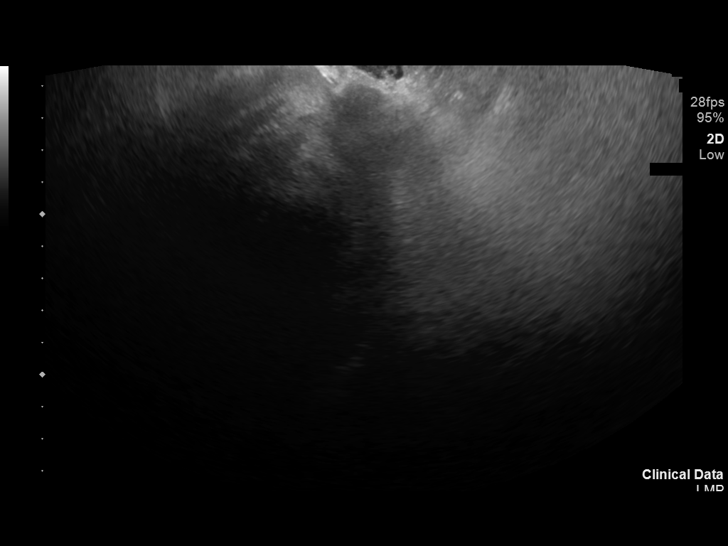
[im 77/84]
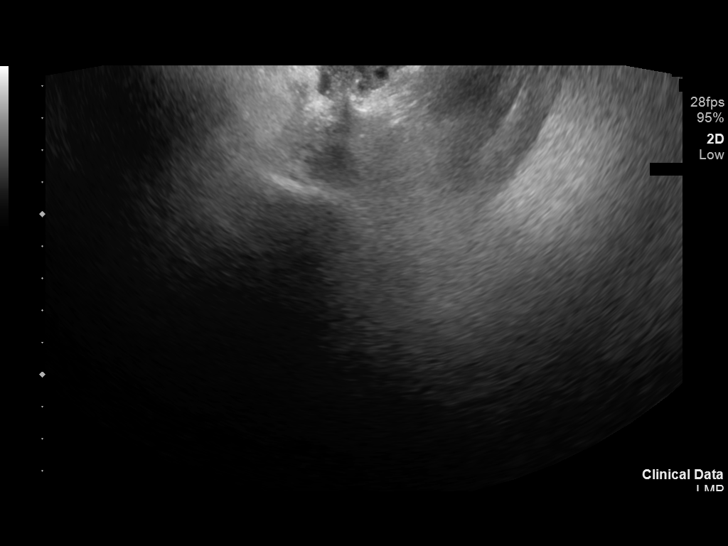
[im 84/84]
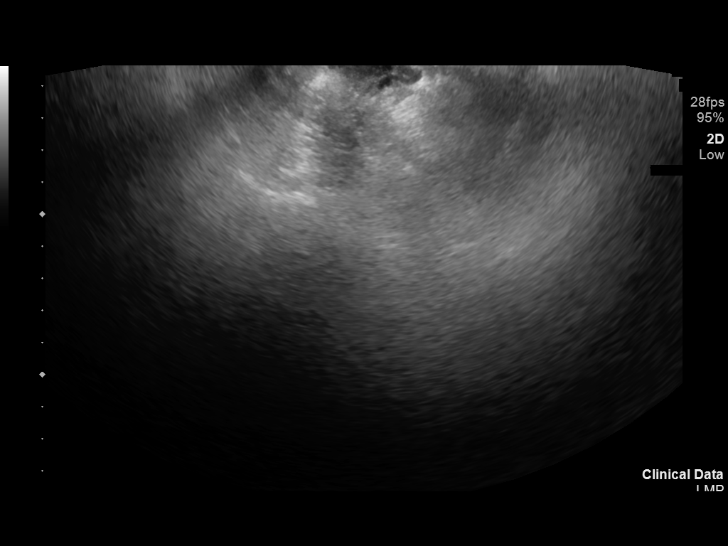

[13 of 25 positions shown; findings below may reference images not displayed]

FINDINGS: Uterus

Heterogeneous solid vascular mass within the mid pelvis extending
into the lower abdomen measuring 15.7 x 10.1 x 11.7 cm (volume = 971
cm^3). At the anterior margin of the mass is a linear fluid
structure probably representing lumen of the endometrium contiguous
with the mass. Additionally, within the right adnexa there is a
second contiguous solid mass measuring 6.9 x 5.1 x 4.8 cm.

Endometrium

Not definitely visualized, see above.

Right ovary

Not visualized.

Left ovary

Not visualized.

Other findings

No abnormal free fluid.
IMPRESSION: Solid vascular mass within the mid pelvis and lower abdomen
measuring up to 15.7 cm (971 cc). The mass probably arises from the
posterior uterus as there is a small linear fluid cavity at the
anterior margin probably representing the endometrial canal.
Findings favor a uterine leiomyoma or leiomyosarcoma. A second
similar contiguous mass extends into the right adnexa measuring up
to 6.9 cm. Normal ovaries are not visualized and the possibility of
an ovarian tumor is not excluded.

## 2020-02-26 ENCOUNTER — Ambulatory Visit: Payer: Self-pay | Admitting: Registered Nurse

## 2020-03-02 ENCOUNTER — Encounter: Payer: Self-pay | Admitting: Registered Nurse

## 2020-03-12 ENCOUNTER — Other Ambulatory Visit: Payer: Self-pay

## 2020-03-12 ENCOUNTER — Ambulatory Visit (INDEPENDENT_AMBULATORY_CARE_PROVIDER_SITE_OTHER): Payer: Self-pay | Admitting: Emergency Medicine

## 2020-03-12 ENCOUNTER — Encounter: Payer: Self-pay | Admitting: Emergency Medicine

## 2020-03-12 VITALS — BP 145/79 | HR 75 | Temp 98.4°F | Resp 16 | Ht 63.0 in | Wt 153.0 lb

## 2020-03-12 DIAGNOSIS — N644 Mastodynia: Secondary | ICD-10-CM

## 2020-03-12 DIAGNOSIS — N63 Unspecified lump in unspecified breast: Secondary | ICD-10-CM

## 2020-03-12 NOTE — Patient Instructions (Addendum)
If you have lab work done today you will be contacted with your lab results within the next 2 weeks.  If you have not heard from [place] then please contact us. The fastest way to get your results is to register for My Chart.   IF you received an x-ray today, you will receive an invoice from [facility] Radiology. Please contact [facility] Radiology at [phone] with questions or concerns regarding your invoice.   IF you received labwork today, you will receive an invoice from [facility]. Please contact [facility] at [phone] with questions or concerns regarding your invoice.   Our billing staff will not be able to assist you with questions regarding bills from these companies.  You will be contacted with the lab results as soon as they are available. The fastest way to get your results is to activate your My Chart account. Instructions are located on the last page of this paperwork. If you have not heard from [place] regarding the results in 2 weeks, please contact this office.     Mamografa Mammogram [employer] es un radiografa de las mamas que se realiza para determinar si hay cambios que no son normales. Este estudio permite explorar y Clinical research associate cualquier cambio que pudiera sugerir la presencia de cncer de mama. Las [employer] se Scientist, product/process development en las mujeres. Un hombre puede hacerse una mamografa si tiene un bulto o hinchazn en la mama. Tambin puede ayudar a identificar otros cambios y variaciones en las mamas. Informe al mdico:  Acerca de cualquier alergia que tenga.  Si tiene implantes mamarios.  Si ha tenido enfermedades, biopsias o cirugas previas de Educational psychologist.  Si est amamantando.  Si es menor de 25aos.  Si tiene antecedentes familiares de cncer de mama.  Si est embarazada o podra estarlo. Cules son los riesgos? En general, se trata de un procedimiento seguro. Sin embargo, pueden ocurrir complicaciones, por ejemplo:  Exposicin a la radiacin.  En [employer], los [place] de radiacin son muy bajos.  La interpretacin [employer].  La necesidad de Education officer, environmental ms [employer].  La imposibilidad de la mamografa de Engineer, manufacturing algunos tipos de cncer. Qu ocurre antes del procedimiento?  Hgase este estudio aproximadamente 1 o 2semanas despus de la [place]. Generalmente, este es el momento en que las mamas estn menos sensibles.  Si consulta a un mdico nuevo o cambia de Financial risk analyst, enve las [employer] anteriores al [employer].  El da del estudio, [address] las [place].  No use desodorantes, perfumes, lociones o talcos el da del estudio.  Qutese las alhajas del cuello.  Use prendas que pueda ponerse y sacarse fcilmente. Qu ocurre durante el procedimiento?   Se quitar la ropa de la cintura para [place]. Se colocar una bata.  Debe permanecer de pie delante de la mquina de rayos-X.  Se colocar cada mama entre dos placas de vidrio o de plstico. Las placas comprimirn las mamas durante unos segundos. Intente estar lo ms relajada posible. Esto no causa ningn dao a las mamas. Si siente alguna molestia, ser pasajera.  Se tomarn radiografas desde diferentes ngulos de cada mama. Este procedimiento puede variar segn el mdico y el hospital. Ladell Heads ocurre despus del procedimiento?  Ileene Rubens ser leda por un especialista (radilogo).  Tal vez deba repetir [employer] del estudio. Esto depende de la calidad de las imgenes.  Pregunte la fecha en que los resultados estarn disponibles. Asegrese de [employer].  Puede volver a sus actividades habituales.  Resumen  Thereasa Solo es un radiografa de baja energa de las mamas que se realiza para determinar si hay cambios anormales. Un hombre puede [employer] examen si tiene un bulto o hinchazn en la mama.  Antes del procedimiento, informe al mdico sobre cualquier problema en las mamas que haya tenido en el  pasado.  Hgase este estudio aproximadamente 1 o 2semanas despus de la [place].  Para el examen, se colocar cada mama entre dos placas de vidrio o de plstico. Las placas comprimirn las mamas durante unos segundos.  Ileene Rubens ser leda por un especialista (radilogo). Pregunte la fecha en que los resultados estarn disponibles. Asegrese de [employer]. Esta informacin no tiene Theme park manager el consejo del mdico. Asegrese de hacerle al mdico cualquier pregunta que tenga. Document Revised: 12/19/2017 Document Reviewed: 12/19/2017 Elsevier Patient Education  2020 [employer].

## 2020-03-12 NOTE — Progress Notes (Signed)
Alexandra Barnes 49 y.o.   Chief Complaint  Patient presents with  . Breast Pain    Left-sometimes breast gets a little swollen and I have a bump inside if touching hurt it, I felt it around Thanksgiving day    HISTORY OF PRESENT ILLNESS: This is a 49 y.o. female complaining of painful lump to left breast since about 2 weeks ago. Denies injury.  No nipple discharge. No other complaints or medical concerns today.  HPI   Prior to Admission medications   Medication Sig Start Date End Date Taking? Authorizing Provider  ibuprofen (ADVIL,MOTRIN) 200 MG tablet Take 200 mg by mouth every 6 (six) hours as needed for moderate pain.   Yes [provider]  acetaminophen (TYLENOL) 500 MG tablet Take 500 mg by mouth every 6 (six) hours as needed for mild pain. Patient not taking: Reported on 03/12/2020    [provider]  ferrous sulfate 325 (65 FE) MG tablet Take 1 tablet (325 mg total) by mouth 2 (two) times daily with a meal. 03/15/18 04/14/18  McDonald, Mia A, PA-C  ondansetron (ZOFRAN) 4 MG tablet Take 1 tablet (4 mg total) by mouth every 6 (six) hours. Patient not taking: No sig reported 03/15/18   McDonald, Mia A, PA-C    No Known Allergies  Patient Active Problem List   Diagnosis Date Noted  . Chronic tension-type headache, not intractable 09/26/2016    Past Medical History:  Diagnosis Date  . History of anemia     Past Surgical History:  Procedure Laterality Date  . TUBAL LIGATION      Social History   Socioeconomic History  . Marital status: Married    Spouse name: Not on file  . Number of children: 4  . Years of education: Not on file  . Highest education level: Not on file  Occupational History  . Not on file  Tobacco Use  . Smoking status: Never Smoker  . Smokeless tobacco: Never Used  Vaping Use  . Vaping Use: Never used  Substance and Sexual Activity  . Alcohol use: No  . Drug use: No  . Sexual activity: Yes  Other Topics Concern  .  Not on file  Social History Narrative  . Not on file   Social Determinants of Health   Financial Resource Strain: Not on file  Food Insecurity: Not on file  Transportation Needs: Not on file  Physical Activity: Not on file  Stress: Not on file  Social Connections: Not on file  Intimate Partner Violence: Not on file    Family History  Problem Relation Age of Onset  . Colon cancer Sister 24     Review of Systems  Constitutional: Negative.  Negative for chills and fever.  HENT: Negative.  Negative for congestion and sore throat.   Respiratory: Negative.  Negative for cough and shortness of breath.   Cardiovascular: Negative.  Negative for chest pain and palpitations.  Gastrointestinal: Negative for abdominal pain, diarrhea, nausea and vomiting.  Genitourinary: Negative.  Negative for dysuria.  Musculoskeletal: Negative.   Skin: Negative.  Negative for rash.  Neurological: Negative.  Negative for dizziness and headaches.  All other systems reviewed and are negative.  Today's Vitals   03/12/20 1511  BP: (!) 145/79  Pulse: 75  Resp: 16  Temp: 98.4 F (36.9 C)  TempSrc: Temporal  SpO2: 97%  Weight: 153 lb (69.4 kg)  Height: 5\' 3"  (1.6 m)   Body mass index is 27.1 kg/m.   Physical  Exam Vitals reviewed. Exam conducted with a chaperone present.  Constitutional:      Appearance: Normal appearance.  HENT:     Head: Normocephalic.  Eyes:     Extraocular Movements: Extraocular movements intact.     Pupils: Pupils are equal, round, and reactive to light.  Cardiovascular:     Rate and Rhythm: Normal rate.  Pulmonary:     Effort: Pulmonary effort is normal.  Chest:  Breasts: Breasts are symmetrical.     Right: Normal. No axillary adenopathy or supraclavicular adenopathy.     Left: Inverted nipple, mass and tenderness present. No swelling, bleeding, nipple discharge, skin change, axillary adenopathy or supraclavicular adenopathy.    Musculoskeletal:     Cervical  back: Normal range of motion.  Lymphadenopathy:     Upper Body:     Right upper body: No supraclavicular, axillary or pectoral adenopathy.     Left upper body: No supraclavicular, axillary or pectoral adenopathy.  Skin:    General: Skin is warm and dry.  Neurological:     General: No focal deficit present.     Mental Status: She is alert and oriented to person, place, and time.  Psychiatric:        Mood and Affect: Mood normal.        Behavior: Behavior normal.      ASSESSMENT & PLAN: Chanti was seen today for breast pain.  Diagnoses and all orders for this visit:  Breast lump in female -     MM Digital Diagnostic Unilat L; Future  Breast pain, left -     MM Digital Diagnostic Unilat L; Future    Patient Instructions       If you have lab work done today you will be contacted with your lab results within the next 2 weeks.  If you have not heard from [place] then please contact us. The fastest way to get your results is to register for My Chart.   IF you received an x-ray today, you will receive an invoice from [facility] Radiology. Please contact [facility] Radiology at [phone] with questions or concerns regarding your invoice.   IF you received labwork today, you will receive an invoice from [facility]. Please contact [facility] at [phone] with questions or concerns regarding your invoice.   Our billing staff will not be able to assist you with questions regarding bills from these companies.  You will be contacted with the lab results as soon as they are available. The fastest way to get your results is to activate your My Chart account. Instructions are located on the last page of this paperwork. If you have not heard from [place] regarding the results in 2 weeks, please contact this office.     Mamografa Mammogram [employer] es un radiografa de las mamas que se realiza para determinar si hay cambios que no son normales. Este estudio permite explorar y Clinical research associate  cualquier cambio que pudiera sugerir la presencia de cncer de mama. Las [employer] se Scientist, product/process development en las mujeres. Un hombre puede hacerse una mamografa si tiene un bulto o hinchazn en la mama. Tambin puede ayudar a identificar otros cambios y variaciones en las mamas. Informe al mdico:  Acerca de cualquier alergia que tenga.  Si tiene implantes mamarios.  Si ha tenido enfermedades, biopsias o cirugas previas de Educational psychologist.  Si est amamantando.  Si es menor de 25aos.  Si tiene antecedentes familiares de cncer de mama.  Si est embarazada o podra estarlo. Cules son  los riesgos? En general, se trata de un procedimiento seguro. Sin embargo, pueden ocurrir complicaciones, por ejemplo:  Exposicin a la radiacin. En [employer], los [place] de radiacin son muy bajos.  La interpretacin [employer].  La necesidad de Education officer, environmental ms [employer].  La imposibilidad de la mamografa de Engineer, manufacturing algunos tipos de cncer. Qu ocurre antes del procedimiento?  Hgase este estudio aproximadamente 1 o 2semanas despus de la [place]. Generalmente, este es el momento en que las mamas estn menos sensibles.  Si consulta a un mdico nuevo o cambia de Financial risk analyst, enve las [employer] anteriores al [employer].  El da del estudio, [address] las [place] y [place].  No use desodorantes, perfumes, lociones o talcos el da del estudio.  Qutese las alhajas del cuello.  Use prendas que pueda ponerse y sacarse fcilmente. Qu ocurre durante el procedimiento?   Se quitar la ropa de la cintura para [place]. Se colocar una bata.  Debe permanecer de pie delante de la mquina de rayos-X.  Se colocar cada mama entre dos placas de vidrio o de plstico. Las placas comprimirn las mamas durante unos segundos. Intente estar lo ms relajada posible. Esto no causa ningn dao a las mamas. Si siente alguna molestia, ser pasajera.  Se tomarn radiografas desde  diferentes ngulos de cada mama. Este procedimiento puede variar segn el mdico y el hospital. Ladell Heads ocurre despus del procedimiento?  Ileene Rubens ser leda por un especialista (radilogo).  Tal vez deba repetir [employer] del estudio. Esto depende de la calidad de las imgenes.  Pregunte la fecha en que los resultados estarn disponibles. Asegrese de [employer].  Puede volver a sus actividades habituales. Resumen  Thereasa Solo es un radiografa de baja energa de las mamas que se realiza para determinar si hay cambios anormales. Un hombre puede [employer] examen si tiene un bulto o hinchazn en la mama.  Antes del procedimiento, informe al mdico sobre cualquier problema en las mamas que haya tenido en el pasado.  Hgase este estudio aproximadamente 1 o 2semanas despus de la [place].  Para el examen, se colocar cada mama entre dos placas de vidrio o de plstico. Las placas comprimirn las mamas durante unos segundos.  Ileene Rubens ser leda por un especialista (radilogo). Pregunte la fecha en que los resultados estarn disponibles. Asegrese de [employer]. Esta informacin no tiene Theme park manager el consejo del mdico. Asegrese de hacerle al mdico cualquier pregunta que tenga. Document Revised: 12/19/2017 Document Reviewed: 12/19/2017 Elsevier Patient Education  2020 Elsevier Inc.      Edwina Barth, MD [facility]

## 2021-01-12 ENCOUNTER — Emergency Department (HOSPITAL_COMMUNITY)
Admission: EM | Admit: 2021-01-12 | Discharge: 2021-01-13 | Disposition: A | Discharge status: Home / Self Care | Payer: Self-pay | Attending: Emergency Medicine | Admitting: Emergency Medicine

## 2021-01-12 ENCOUNTER — Other Ambulatory Visit: Payer: Self-pay

## 2021-01-12 ENCOUNTER — Encounter (HOSPITAL_COMMUNITY): Payer: Self-pay

## 2021-01-12 DIAGNOSIS — H5712 Ocular pain, left eye: Secondary | ICD-10-CM | POA: Insufficient documentation

## 2021-01-12 MED ORDER — TETRACAINE HCL 0.5 % OP SOLN
2.0000 [drp] | Freq: Once | OPHTHALMIC | Status: AC
  Administered 2021-01-13: 2 [drp] via OPHTHALMIC
  Filled 2021-01-12: qty 4

## 2021-01-12 NOTE — ED Provider Notes (Signed)
Emergency Medicine Provider Triage Evaluation Note  Alexandra Barnes , a 50 y.o. female  was evaluated in triage.  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 One week ago her face had some red splotchy appearance. Then six days ago noticed some irritation of left eye. Denies any blurred vision.   Now only has redness of left eye.    Review of Systems  Positive: Left eye pain, irritation Negative: Blurry vision  Physical Exam  BP (!) 147/75 (BP Location: Right Arm)   Pulse 96   Temp 98.2 F (36.8 C) (Oral)   Resp 18   Ht 5\' 3"  (1.6 m)   Wt 67.8 kg   LMP  (LMP Unknown)   SpO2 100%   BMI 26.47 kg/m  Gen:   Awake, no distress   Resp:  Normal effort  MSK:   Moves extremities without difficulty  Other:  Left eye erythema  Medical Decision Making  Medically screening exam initiated at 7:58 PM.  Appropriate orders placed.  Alexandra Barnes was informed that the remainder of the evaluation will be completed by another provider, this initial triage assessment does not replace that evaluation, and the importance of remaining in the ED until their evaluation is complete.  Exposed to chloride, ajax, degreaser.   Will need visual acuity, eye exam orders placed.   Alexandra Barnes, Gailen Shelter 01/12/21 2006    03/14/21, MD 01/13/21 [phone]

## 2021-01-12 NOTE — ED Triage Notes (Signed)
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 One week ago her face had some red splotchy appearance. Then six days ago noticed some irritation of left eye. Denies any blurred vision. Now only has redness of left eye. Pt is Spanish speaking needs interpretation.

## 2021-01-13 MED ORDER — ERYTHROMYCIN 5 MG/GM OP OINT
TOPICAL_OINTMENT | Freq: Once | OPHTHALMIC | Status: AC
  Administered 2021-01-13: 1 via OPHTHALMIC
  Filled 2021-01-13: qty 3.5

## 2021-01-13 MED ORDER — FLUORESCEIN SODIUM 1 MG OP STRP
1.0000 | ORAL_STRIP | Freq: Once | OPHTHALMIC | Status: AC
  Administered 2021-01-13: 1 via OPHTHALMIC
  Filled 2021-01-13: qty 1

## 2021-01-13 NOTE — ED Notes (Signed)
Pt ambulatory to restroom w/out assistance.  

## 2021-01-13 NOTE — ED Notes (Signed)
Ophthalmic solution, fluorescein strip, tonopen, and woods lamp at bedside. Unable to perform visual acuity screening due to patient not speaking or reading [place].

## 2021-01-13 NOTE — Discharge Instructions (Addendum)
Use the erythromycin eye ointment four times daily in your left eye until you follow up with the eye doctor.  Call today to try to get an appointment with the eye doctor today or tomorrow.

## 2021-01-13 NOTE — ED Provider Notes (Signed)
[facility]-EMERGENCY DEPT Provider Note   CSN: [id number] Arrival date & time: 01/12/21  1905     History Chief Complaint  Patient presents with   Eye Pain   eye redness    Alexandra Barnes is a 50 y.o. female.  The history is provided by the patient. A language interpreter was used.  Eye Pain Alexandra Barnes is a 50 y.o. female who presents to the Emergency Department complaining of eye pain and redness. 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 That day her face became red. The next day she developed redness in the left eye with development of left eye pain. Since that time the pain has been coming and going in her left eye. When she does have the pain as she has photophobia. She currently has no pain after taking ibuprofen. She also started taking Lumify eyedrops yesterday. She does not use corrective lenses. She has known medical problems.       Past Medical History:  Diagnosis Date   History of anemia     Patient Active Problem List   Diagnosis Date Noted   Chronic tension-type headache, not intractable 09/26/2016    Past Surgical History:  Procedure Laterality Date   TUBAL LIGATION       OB History   No obstetric history on file.     Family History  Problem Relation Age of Onset   Colon cancer Sister 7    Social History   Tobacco Use   Smoking status: Never   Smokeless tobacco: Never  Vaping Use   Vaping Use: Never used  Substance Use Topics   Alcohol use: No   Drug use: No    Home Medications Prior to Admission medications   Medication Sig Start Date End Date Taking? Authorizing Provider  acetaminophen (TYLENOL) 500 MG tablet Take 500 mg by mouth every 6 (six) hours as needed for mild pain. Patient not taking: Reported on 03/12/2020    [provider]  ferrous sulfate 325 (65 FE) MG tablet Take 1 tablet (325 mg total) by mouth 2 (two)  times daily with a meal. 03/15/18 04/14/18  McDonald, Mia A, PA-C  ibuprofen (ADVIL,MOTRIN) 200 MG tablet Take 200 mg by mouth every 6 (six) hours as needed for moderate pain.    [provider]  ondansetron (ZOFRAN) 4 MG tablet Take 1 tablet (4 mg total) by mouth every 6 (six) hours. Patient not taking: No sig reported 03/15/18   McDonald, Mia A, PA-C    Allergies    Patient has no known allergies.  Review of Systems   Review of Systems  Eyes:  Positive for pain.  All other systems reviewed and are negative.  Physical Exam Updated Vital Signs BP (!) 143/88   Pulse 83   Temp 98.2 F (36.8 C) (Oral)   Resp 20   Ht 5\' 3"  (1.6 m)   Wt 67.8 kg   LMP  (LMP Unknown)   SpO2 99%   BMI 26.47 kg/m   Physical Exam Vitals and nursing note reviewed.  Constitutional:      Appearance: She is well-developed.  HENT:     Head: Normocephalic and atraumatic.     Comments: The left eye has significant conjunctival injection. Pupils equal round and reactive, EOMI. Patient unable to tolerate tononmetry secondary to discomfort. No uptake on fluorescein staining. Cardiovascular:     Rate and Rhythm: Normal rate  and regular rhythm.     Heart sounds: No murmur heard. Pulmonary:     Effort: Pulmonary effort is normal. No respiratory distress.     Breath sounds: Normal breath sounds.  Abdominal:     Palpations: Abdomen is soft.     Tenderness: There is no abdominal tenderness. There is no guarding or rebound.  Musculoskeletal:        General: No tenderness.  Skin:    General: Skin is warm and dry.  Neurological:     Mental Status: She is alert and oriented to person, place, and time.  Psychiatric:        Behavior: Behavior normal.    ED Results / Procedures / Treatments   Labs (all labs ordered are listed, but only abnormal results are displayed) Labs Reviewed - No data to display  EKG None  Radiology No results found.  Procedures Procedures   Medications Ordered in  ED Medications  tetracaine (PONTOCAINE) 0.5 % ophthalmic solution 2 drop (has no administration in time range)  fluorescein ophthalmic strip 1 strip (has no administration in time range)  erythromycin ophthalmic ointment (has no administration in time range)    ED Course  I have reviewed the triage vital signs and the nursing notes.  Pertinent labs & imaging results that were available during my care of the patient were reviewed by me and considered in my medical decision making (see chart for details).    MDM Rules/Calculators/A&P                          patient here for evaluation of left eye pain, did have a chemical exposure the date prior to her pain developed.  Her vision is 20/400 in the right eye, 20/200 and the left eye. There is difficulty in patient understanding reading the eyechart. I examination was performed with interpreter present, patient states that she is seeing at her baseline. No evidence of foreign body, corneal ulcer. Low index of suspicion of acute angle closure glaucoma. Concern for possible iritis. Given patient's persistent and worsening symptoms recommend ophthalmology referral. Will start erythromycin drops with prompt referral.  Final Clinical Impression(s) / ED Diagnoses Final diagnoses:  Left eye pain    Rx / DC Orders ED Discharge Orders     None        Tilden Fossa, MD 01/13/21 [phone]

## 2021-02-04 ENCOUNTER — Other Ambulatory Visit: Payer: Self-pay

## 2021-02-04 DIAGNOSIS — N6320 Unspecified lump in the left breast, unspecified quadrant: Secondary | ICD-10-CM

## 2021-03-16 ENCOUNTER — Other Ambulatory Visit: Payer: Self-pay | Admitting: Obstetrics and Gynecology

## 2021-03-16 ENCOUNTER — Ambulatory Visit: Payer: Self-pay | Admitting: *Deleted

## 2021-03-16 ENCOUNTER — Other Ambulatory Visit: Payer: Self-pay

## 2021-03-16 ENCOUNTER — Ambulatory Visit
Admission: RE | Admit: 2021-03-16 | Discharge: 2021-03-16 | Disposition: A | Discharge status: Home / Self Care | Payer: No Typology Code available for payment source | Source: Ambulatory Visit | Attending: Obstetrics and Gynecology | Admitting: Obstetrics and Gynecology

## 2021-03-16 ENCOUNTER — Ambulatory Visit
Admission: RE | Admit: 2021-03-16 | Discharge: 2021-03-16 | Disposition: A | Discharge status: Home / Self Care | Payer: Self-pay | Source: Ambulatory Visit | Attending: Obstetrics and Gynecology | Admitting: Obstetrics and Gynecology

## 2021-03-16 VITALS — BP 142/90 | Wt 151.0 lb

## 2021-03-16 DIAGNOSIS — N6320 Unspecified lump in the left breast, unspecified quadrant: Secondary | ICD-10-CM

## 2021-03-16 DIAGNOSIS — N644 Mastodynia: Secondary | ICD-10-CM

## 2021-03-16 DIAGNOSIS — Z1211 Encounter for screening for malignant neoplasm of colon: Secondary | ICD-10-CM

## 2021-03-16 DIAGNOSIS — N6325 Unspecified lump in the left breast, overlapping quadrants: Secondary | ICD-10-CM

## 2021-03-16 DIAGNOSIS — Z1239 Encounter for other screening for malignant neoplasm of breast: Secondary | ICD-10-CM

## 2021-03-16 NOTE — Patient Instructions (Signed)
Explained breast self awareness with Burna Mortimer. Patient did not need a Pap smear today due to patient has a history of a hysterectomy for benign reasons. Let patient know that she doesn't need any further Pap smears due to her history of a hysterectomy for benign reasons. Referred [facility] to [facility] for a diagnostic mammogram. Appointment scheduled Tuesday, March 16, 2021 at 1030. Patient aware of appointment and will be there. Natalee Tomkiewicz verbalized understanding.  M[place]ow Abramo, Kathaleen Maser, RN 8:50 AM

## 2021-03-16 NOTE — Progress Notes (Signed)
[name] is a 50 y.o. female who presents to [facility] clinic today with complaint of left breast lump x one year that was not painful then has become painful over the past month when touched. Patient rates the pain at a 2-3 out of 10. Patient stated she has developed two sores near left breast lump x one month that has been bleeding. Complaints of right breast diffuse breast pain x one month. Patient rates the pain at a 1 out of 10.   Pap Smear: Pap smear not completed today. Last Pap smear was 02/26/2018 at the GYN Oncology clinic at the [facility] and was normal. Per patient has no history of an abnormal Pap smear. Patient has a history of a hysterectomy 03/12/2018 due to fibroids and AUB. Patient doesn't need any further Pap smears due to her history of a hysterectomy for benign reasons per BCCCP and ASCCP guidelines. Last Pap smear result is available in Epic.   Physical exam: Breasts Breasts symmetrical. No skin abnormalities right breast. Two red colored lumps on skin between 8 o'clock and 10 o'clock 2 cm from the nipple that are open and per patient bleed at times. Observed a red area left breast at 9 o'clock next to areola. Bilateral nipple inversion that per patient has been a change within the right breast over the past 7 months and left over the past year. Patient states the left one initially began to invert and now is pushing back out. No nipple discharge bilateral breasts. No lymphadenopathy. No lumps palpated right breast. Palpated a lump within the left breast at 9 o'clock 3 cm from the nipple. Complaints of pain when palpated left inner and lower breast on exam.   MS DIGITAL DIAG TOMO BILAT  Result Date: 03/16/2021 CLINICAL DATA:  One year of left nipple inversion and swelling. The region of swelling recently ruptured, exuding puss. The patient now has erythema along the lateral aspect of the left areola. Developing right nipple inversion. EXAM: DIGITAL DIAGNOSTIC  BILATERAL MAMMOGRAM WITH TOMOSYNTHESIS AND CAD; ULTRASOUND LEFT BREAST LIMITED; ULTRASOUND RIGHT BREAST LIMITED TECHNIQUE: Bilateral digital diagnostic mammography and breast tomosynthesis was performed. The images were evaluated with computer-aided detection.; Targeted ultrasound examination of the left breast was performed.; Targeted ultrasound examination of the right breast was performed COMPARISON:  Previous exam(s). ACR Breast Density Category b: There are scattered areas of fibroglandular density. FINDINGS: There is right nipple inversion. There is a mass in the lateral right breast at 9 o'clock, 1 cm from the nipple. There is a second smaller adjacent mass, posterior to the larger mass, best seen on the MLO view. There are 4 separate groups of calcifications in the right breast. One of these groups of calcifications may demonstrate layering, just superior to the dominant mass on the 90 degree magnified view. There is left nipple inversion. There is increased density in the region of the patient's draining site, located medially in the left breast. No other suspicious mammographic findings are identified. On physical exam, bilateral nipple inversion. There is also erythema along the lateral aspect of the left areola. Targeted ultrasound is performed, showing a mass in the right breast at 9 o'clock, 1 cm from the nipple measuring 1.8 x 1.1 x 2.1 cm. The mass is irregular in shape. There is a smaller mass at 9 o'clock, 3 cm from the nipple measuring 0.3 x 0.2 x 0.6 cm. These 2 masses likely correlate with the mammographically identified masses. No right-sided axillary adenopathy. In the  region of the patient's drainage, there is a fluid collection measuring 3.1 x 0.9 x 2.7 cm. No axillary adenopathy. No other sonographic abnormalities are identified. No abnormalities identified behind either nipple to explain the nipple inversion. IMPRESSION: Four groups of indeterminate calcifications in the right breast. 2  masses in the right breast seen at 9 o'clock, 1 cm from the nipple and 9 o'clock, 3 cm from the nipple. Bilateral nipple inversion. RECOMMENDATION: Recommend ultrasound-guided biopsy of the right mass at 9 o'clock, 1 cm from the nipple. Recommend biopsy of 2 of the groups of right breast calcifications. Recommend biopsying the most superior group which are pleomorphic and span 1 mm. Recommend biopsying the most inferior group which are primarily round and punctate and span up to 7 mm. The patient was given 10 days of doxycycline to treat the resolving abscess and erythema. The patient is to return in 2 weeks to ensure resolution of the erythema and resolving abscess. If the cause for the patient's bilateral nipple inversion is not identified with biopsies, recommend breast MRI. If all the biopsies are benign and MRI demonstrates no evidence of malignancy, recommend six-month follow-up mammogram and ultrasound of the 2 remaining groups of calcifications and the mass at 9 o'clock, 3 cm from the nipple on the right. I have discussed the findings and recommendations with the patient. If applicable, a reminder letter will be sent to the patient regarding the next appointment. BI-RADS CATEGORY  4: Suspicious. Electronically Signed   By: Gerome Sam III M.D.   On: 03/16/2021 14:22   Pelvic/Bimanual Pap is not indicated today per BCCCP guidelines.   Smoking History: Patient has never smoked.   Patient Navigation: Patient education provided. Access to services provided for patient through [place] program. Spanish interpreter Natale Lay from [facility] provided. Patient provided transportation by Uber/Lyft to appointment at the [facility] and home. Patient has food insecurities. Patient given a grab bag of groceries from the food market at the [employer].  Colorectal Cancer Screening: Per patient has never had colonoscopy completed. FIT Test given to patient to complete. No complaints today.     Breast and Cervical Cancer Risk Assessment: Patient has family history of a maternal aunt having breast cancer. Patient has no known genetic mutations or history of radiation treatment to the chest before age 33. Patient does not have history of cervical dysplasia, immunocompromised, or DES exposure in-utero.  Risk Assessment     Risk Scores       03/16/2021   Last edited by: Meryl Dare, CMA   5-year risk: 0.5 %   Lifetime risk: 5.3 %           A: BCCCP exam without pap smear Complaint of left breast lump and bilateral breast pain.  P: Referred patient to the [facility] for a diagnostic mammogram. Appointment scheduled Tuesday, March 16, 2021 at 1030.  Priscille Heidelberg, RN 03/16/2021 8:50 AM

## 2021-03-23 ENCOUNTER — Ambulatory Visit
Admission: RE | Admit: 2021-03-23 | Discharge: 2021-03-23 | Disposition: A | Discharge status: Home / Self Care | Payer: No Typology Code available for payment source | Source: Ambulatory Visit | Attending: Obstetrics and Gynecology | Admitting: Obstetrics and Gynecology

## 2021-03-23 DIAGNOSIS — N6320 Unspecified lump in the left breast, unspecified quadrant: Secondary | ICD-10-CM

## 2021-03-23 HISTORY — PX: BREAST BIOPSY: SHX20

## 2021-03-30 ENCOUNTER — Other Ambulatory Visit: Payer: Self-pay | Admitting: Obstetrics and Gynecology

## 2021-03-30 DIAGNOSIS — N6459 Other signs and symptoms in breast: Secondary | ICD-10-CM

## 2021-03-31 ENCOUNTER — Ambulatory Visit: Payer: No Typology Code available for payment source

## 2021-03-31 ENCOUNTER — Ambulatory Visit
Admission: RE | Admit: 2021-03-31 | Discharge: 2021-03-31 | Disposition: A | Discharge status: Home / Self Care | Payer: No Typology Code available for payment source | Source: Ambulatory Visit | Attending: Obstetrics and Gynecology | Admitting: Obstetrics and Gynecology

## 2021-03-31 DIAGNOSIS — N6320 Unspecified lump in the left breast, unspecified quadrant: Secondary | ICD-10-CM

## 2021-04-01 ENCOUNTER — Other Ambulatory Visit: Payer: Self-pay | Admitting: Obstetrics and Gynecology

## 2021-04-01 DIAGNOSIS — N6320 Unspecified lump in the left breast, unspecified quadrant: Secondary | ICD-10-CM

## 2021-04-26 ENCOUNTER — Other Ambulatory Visit: Payer: Self-pay

## 2021-04-26 ENCOUNTER — Inpatient Hospital Stay: Payer: Self-pay | Attending: Obstetrics and Gynecology | Admitting: *Deleted

## 2021-04-26 VITALS — BP 142/98 | Ht 63.0 in | Wt 154.0 lb

## 2021-04-26 DIAGNOSIS — Z0000 Encounter for general adult medical examination without abnormal findings: Secondary | ICD-10-CM

## 2021-04-26 NOTE — Progress Notes (Addendum)
Wisewoman initial screening   Interpreter- Natale Lay, [place]   Clinical Measurement:  Vitals:   04/26/21 0857  BP: (!) 148/98   Fasting Labs Drawn Today, will review with patient when they result.   Medical History:  Patient states that she  does not know if she has  high cholesterol, has high blood pressure and she  does not know if she has  diabetes.  Medications:  Patient states that she does not take medication to lower cholesterol, blood pressure or blood sugar.  Patient does not take an aspirin a day to help prevent a heart attack or stroke.    Blood pressure, self measurement: Patient states that she does not measure blood pressure from home. She checks her blood pressure N/A. She shares her readings with a health care provider: N/A.   Nutrition: Patient states that on average she eats 0 cups of fruit and 0 cups of vegetables per day. Patient states that she does not eat fish at least 2 times per week. Patient eats less than half servings of whole grains. Patient drinks less than 36 ounces of beverages with added sugar weekly: yes. Patient is currently watching sodium or salt intake: yes. In the past 7 days patient has consumed drinks containing alcohol on 0 days. On a day that patient consumes drinks containing alcohol on average 0 drinks are consumed.      Physical activity:  Patient states that she gets 100 minutes of moderate and 0 minutes of vigorous physical activity each week.  Smoking status:  Patient states that she has has never smoked .   Quality of life:  Over the past 2 weeks patient states that she had little interest or pleasure in doing things: not at all. She has been feeling down, depressed or hopeless:not at all.    Risk reduction and counseling:   Health Coaching: Spoke with patient about the daily recommendation for fruits and vegetables (2 c. Fruit and 3 c. Vegetables). Showed patient what a serving size would like like. Gave patient a serving container to  use at home. Encouraged patient to increase fish intake. Gave examples of heart healthy fish (salmon, tuna, mackerel, sardines, sea bass and trout) that patient can try adding into diet. Patient consumes oatmeal and cereals but not regularly. Gave examples of other whole grains that she can try (whole wheat bread, brown rice and whole wheat pasta). Patient consumes 1 soda a day currently. Encouraged patient to cut back on the amount of soda that she consumes. Explained that the recommendation is for 36 ounces or less per week. Encouraged patient to continue watching sodium intake due to elevated BP. Patient does a good amount of walking throughout the week. Patient walks to and from work 5 days a week for 40 minutes round trip.   Goal: Patient would like to set a goal to drink less sodas, salt and fat. Patient would like to also increase exercise for 2-3 times a week for 20 minutes at a time. Patient will work on reaching this goal over the next 3 months.    Navigation:  I will notify patient of lab results.  Patient is aware of 2 more health coaching sessions and a follow up. Will refer patient to Internal Medicine for elevated BP. Provided patient with transportation home and with shopping experience in the [place] food market.  Time: 20 minutes

## 2021-04-27 LAB — LIPID PANEL
Chol/HDL Ratio: 4.1 ratio (ref 0.0–4.4)
Cholesterol, Total: 215 mg/dL — ABNORMAL HIGH (ref 100–199)
HDL: 52 mg/dL (ref >39)
LDL Chol Calc (NIH): 143 mg/dL — ABNORMAL HIGH (ref 0–99)
Triglycerides: 111 mg/dL (ref 0–149)
VLDL Cholesterol Cal: 20 mg/dL (ref 5–40)

## 2021-04-27 LAB — GLUCOSE, RANDOM: Glucose: 102 mg/dL — ABNORMAL HIGH (ref 70–99)

## 2021-04-27 LAB — HEMOGLOBIN A1C
Est. average glucose Bld gHb Est-mCnc: 131 mg/dL
Hgb A1c MFr Bld: 6.2 % — ABNORMAL HIGH (ref 4.8–5.6)

## 2021-05-03 ENCOUNTER — Other Ambulatory Visit: Payer: Self-pay

## 2021-05-03 ENCOUNTER — Telehealth: Payer: Self-pay

## 2021-05-03 NOTE — Telephone Encounter (Signed)
Health coaching 2   interpreter- Natale Lay, [employer]   Labs- 215 cholesterol, 143 LDL cholesterol, 111 triglycerides, 52 HDL cholesterol, 6.2 hemoglobin A1C, 102 mean plasma glucose.  Patient understands and is aware of her lab results.   Goals-  Spoke with patient about practicing a heart healthy diet. Spoke with patient about reducing the amount of fried and fatty foods consumed. Decreasing the amount of red meats consumed. Consuming more lean proteins like chicken and fish. Eating more whole grains, as well as fruits and vegetables. Also spoke with patient about decreasing the amount of sweets and sugars consumed. As well as cutting back on the amount of carbs consumed. Encouraged patient to try and start exercising for at least 20-30 minutes a day.   Navigation:  Patient is aware of 1 more health coaching sessions and a follow up. Will call patient with follow-up appointment information for Internal Medicine once appointment is scheduled for elevated labs and BP. Will schedule transportation for patient's follow-up appointment.  Time-  15 minutes

## 2021-05-03 NOTE — Telephone Encounter (Signed)
Opened in error

## 2021-05-06 ENCOUNTER — Ambulatory Visit
Admission: RE | Admit: 2021-05-06 | Discharge: 2021-05-06 | Disposition: A | Discharge status: Home / Self Care | Payer: No Typology Code available for payment source | Source: Ambulatory Visit | Attending: Obstetrics and Gynecology | Admitting: Obstetrics and Gynecology

## 2021-05-06 ENCOUNTER — Other Ambulatory Visit: Payer: Self-pay | Admitting: Obstetrics and Gynecology

## 2021-05-06 DIAGNOSIS — N6320 Unspecified lump in the left breast, unspecified quadrant: Secondary | ICD-10-CM

## 2021-05-14 ENCOUNTER — Ambulatory Visit
Admission: RE | Admit: 2021-05-14 | Discharge: 2021-05-14 | Disposition: A | Discharge status: Home / Self Care | Payer: No Typology Code available for payment source | Source: Ambulatory Visit | Attending: Obstetrics and Gynecology | Admitting: Obstetrics and Gynecology

## 2021-05-14 ENCOUNTER — Other Ambulatory Visit: Payer: Self-pay

## 2021-05-14 DIAGNOSIS — N6320 Unspecified lump in the left breast, unspecified quadrant: Secondary | ICD-10-CM

## 2021-05-19 ENCOUNTER — Encounter: Payer: No Typology Code available for payment source | Admitting: Internal Medicine

## 2021-05-19 LAB — AEROBIC/ANAEROBIC CULTURE W GRAM STAIN (SURGICAL/DEEP WOUND)
Culture: NO GROWTH
Gram Stain: NONE SEEN
Special Requests: NORMAL

## 2021-05-24 ENCOUNTER — Ambulatory Visit (INDEPENDENT_AMBULATORY_CARE_PROVIDER_SITE_OTHER): Payer: Self-pay | Admitting: Student

## 2021-05-24 DIAGNOSIS — R7303 Prediabetes: Secondary | ICD-10-CM

## 2021-05-24 DIAGNOSIS — R030 Elevated blood-pressure reading, without diagnosis of hypertension: Secondary | ICD-10-CM

## 2021-05-24 DIAGNOSIS — E785 Hyperlipidemia, unspecified: Secondary | ICD-10-CM

## 2021-05-24 NOTE — Assessment & Plan Note (Signed)
Vitals:   05/24/21 1355 05/24/21 1428  BP: (!) 163/77 (!) 151/67   Patient with elevated blood pressure readings x2 in clinic today.  Upon chart review, last blood pressure at general surgery clinic was 122/80.  Unclear if patient truly has hypertension or if this is whitecoat related hypertension.  She does not want to establish care here, but I did advise for her to advise with a primary care doctor who can keep a close eye on blood pressure and initiate antihypertensive therapy if necessary.

## 2021-05-24 NOTE — Assessment & Plan Note (Signed)
Patient presenting for follow-up after an A1c reading of 6.2%, consistent with prediabetes.  Discussed at length what lifestyle modifications to implement in order to minimize chances of progression to diabetes.  Provided a pamphlet on diabetic diet and educated on moderate intensity exercise for at least 30 minutes a day for 5 days a week.  Educated on cutting down intake of soft drinks.  ASCVD risk of 2.2%.  Plan: -Diabetic diet, moderate intensity exercise for greater than 150 minutes/week -Repeat A1c in 1 year

## 2021-05-24 NOTE — Progress Notes (Signed)
° °  CC: Wisewoman follow-up  HPI:  Alexandra Barnes is a 51 y.o. female with history listed below presenting to the [facility] for wisewoman follow-up for elevated A1c and cholesterol. Please see individualized problem based charting for full HPI.  Past Medical History:  Diagnosis Date   History of anemia     Review of Systems:  Negative aside from that listed in individualized problem based charting.  Physical Exam:  Vitals:   05/24/21 1355  BP: (!) 163/77  Pulse: 78  Temp: 97.7 F (36.5 C)  TempSrc: Oral  SpO2: 100%  Weight: 151 lb 3.2 oz (68.6 kg)   Physical Exam Constitutional:      Appearance: Normal appearance. She is not ill-appearing.  HENT:     Mouth/Throat:     Mouth: Mucous membranes are moist.     Pharynx: Oropharynx is clear.  Eyes:     Extraocular Movements: Extraocular movements intact.     Conjunctiva/sclera: Conjunctivae normal.     Pupils: Pupils are equal, round, and reactive to light.  Cardiovascular:     Rate and Rhythm: Normal rate and regular rhythm.     Pulses: Normal pulses.     Heart sounds: Normal heart sounds. No murmur heard.   No gallop.  Pulmonary:     Effort: Pulmonary effort is normal.     Breath sounds: Normal breath sounds. No wheezing, rhonchi or rales.  Abdominal:     General: Bowel sounds are normal. There is no distension.     Palpations: Abdomen is soft.     Tenderness: There is no abdominal tenderness.  Musculoskeletal:        General: No swelling. Normal range of motion.  Skin:    General: Skin is warm and dry.  Neurological:     General: No focal deficit present.     Mental Status: She is alert and oriented to person, place, and time.  Psychiatric:        Mood and Affect: Mood normal.        Behavior: Behavior normal.     Assessment & Plan:   See Encounters Tab for problem based charting.  Patient discussed with Dr. Heide Spark

## 2021-05-24 NOTE — Assessment & Plan Note (Signed)
Patient with recent lipid panel showing total cholesterol 215, LDL 143, HDL 52.  ASCVD risk of 2.2%.  She states that she typically does not eat oily or processed foods and has been trying to eat healthy.  Given low ASCVD risk, provided patient with the choice of initiating pharmacotherapy with statin or trying lifestyle modifications with exercise and a healthier diet.  Patient prefers to try the latter.  Plan: -Lifestyle modifications -Repeat lipid panel in 6 months to 1 year, initiate statin therapy should cholesterol levels remain high

## 2021-05-24 NOTE — Patient Instructions (Addendum)
Derinda Late,  Fue un placer verte hoy en la clnica.  1. Para su prediabetes, intente usar el folleto adjunto para ayudar a [employer] de lo que come. 2. Para su colesterol, trate de evitar los alimentos grasos y procesados tanto como sea posible. 3. Su presin arterial estaba alta en la clnica hoy, as que asegrese de Clinical research associate un mdico de atencin primaria para que lo vigile. 4. Haga ejercicio unos 30 minutos al da durante 5 das a la semana para ayudar con sus niveles de azcar, colesterol y presin arterial.  Llame a Lawana Chambers al [phone] si tiene alguna pregunta o inquietud. El mejor horario para llamar es de lunes a viernes de 9 a. m. a 4 p. m., pero hay alguien disponible las 24 horas del da, los 7 das de la semana en el mismo nmero. Si necesita reposicin de medicamentos, por favor notifique a su farmacia con una semana de anticipacin y ellos nos enviarn una solicitud.  Gracias por dejarnos participar en su cuidado. Esperamos verte la prxima vez!

## 2021-05-26 NOTE — Progress Notes (Signed)
Internal Medicine Clinic Attending  Case discussed with Dr. Jinwala  At the time of the visit.  We reviewed the resident's history and exam and pertinent patient test results.  I agree with the assessment, diagnosis, and plan of care documented in the resident's note.  

## 2021-09-20 ENCOUNTER — Telehealth: Payer: Self-pay

## 2021-09-20 NOTE — Telephone Encounter (Signed)
Called patient to complete [facility] for [facility] but patient was at work and unable to talk on the phone. Will try to call back at a later time.

## 2022-12-18 IMAGING — US US BREAST*L* LIMITED INC AXILLA
1 series · 13 of 15 positions shown · non-contrast
Comparison: Previous exam(s).

CLINICAL DATA: Patient was initially evaluated on 03/16/2021 for
left breast swelling, erythema, and purulent drainage as well as
bilateral nipple inversion. A 3.1 cm fluid collection was identified
in the medial left breast, at site of drainage. Patient was treated
with 10 day course of doxycycline with improvement of swelling and
redness.

[Series 1: us breast*left* limited inc axilla · 0.07mm/px · 15 acquisitions, 13 frames shown]
[im 1/15]
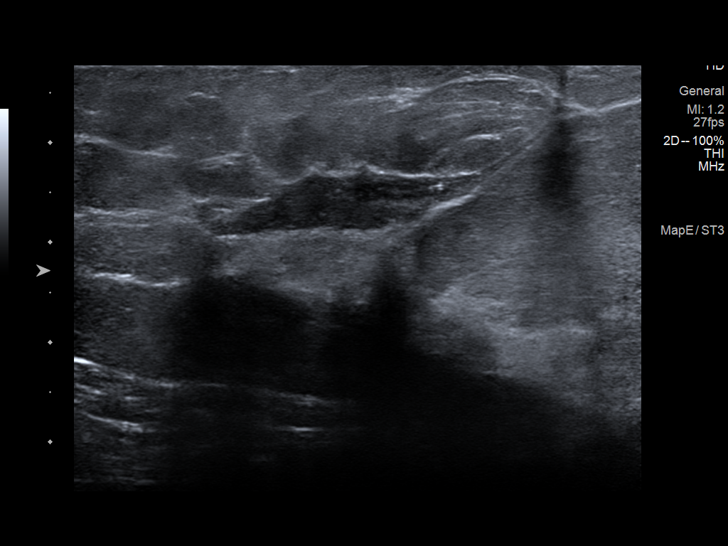
[im 2/15]
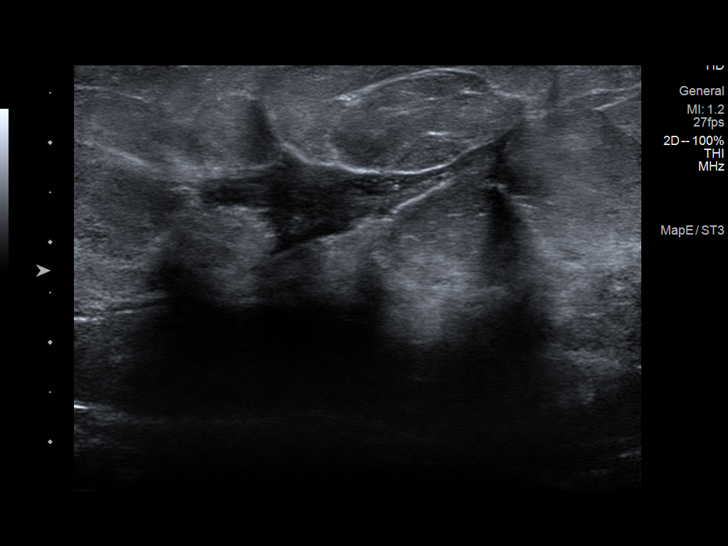
[im 3/15]
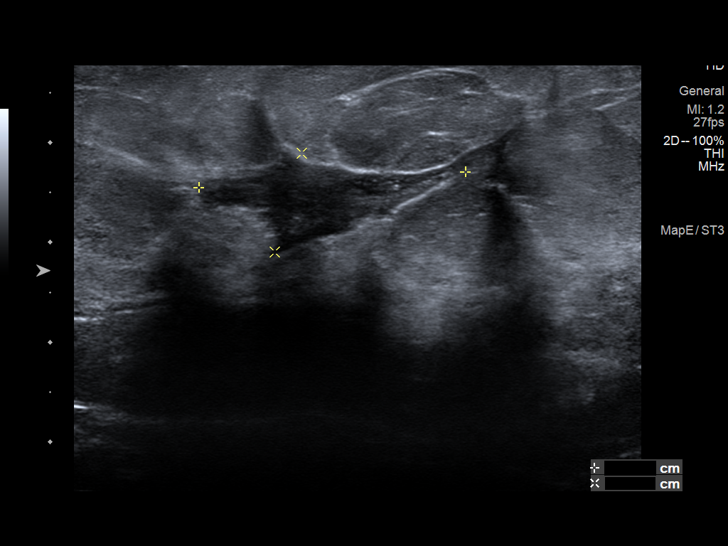
[im 5/15]
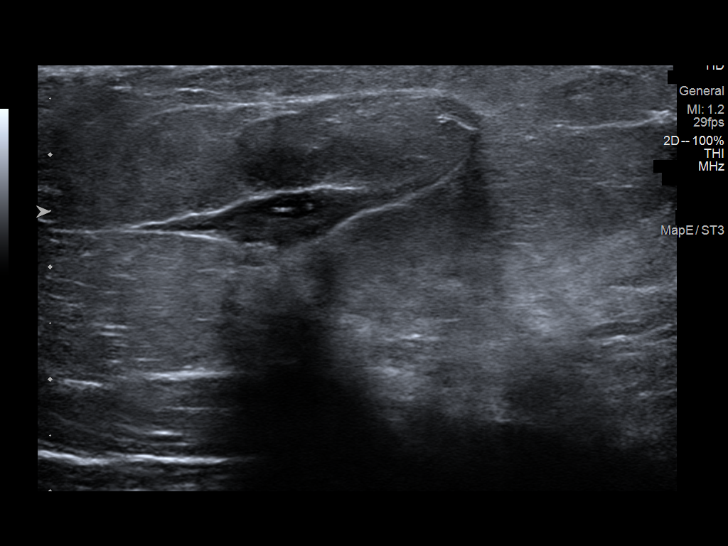
[im 6/15]
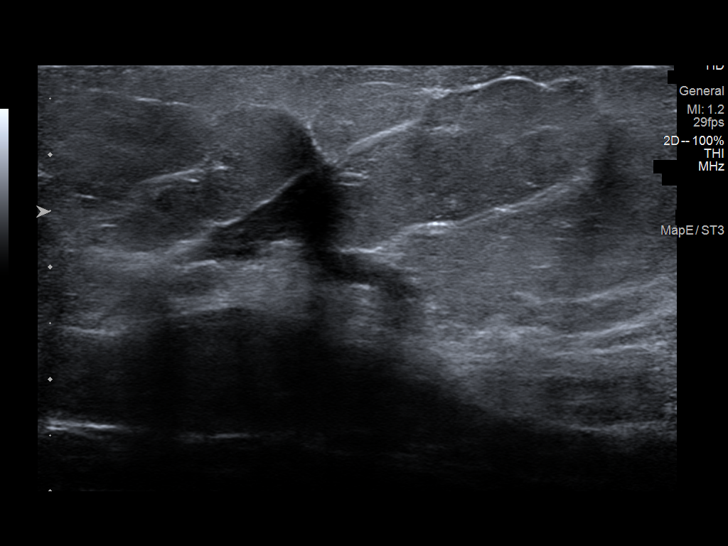
[im 7/15]
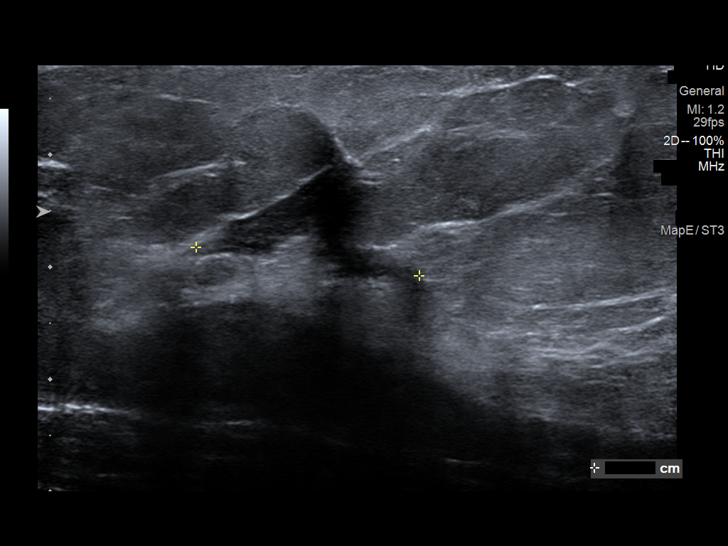
[im 8/15]
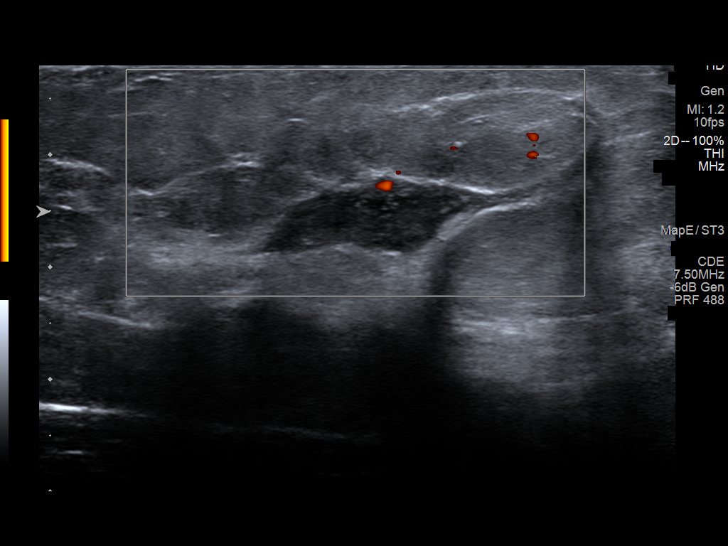
[im 9/15]
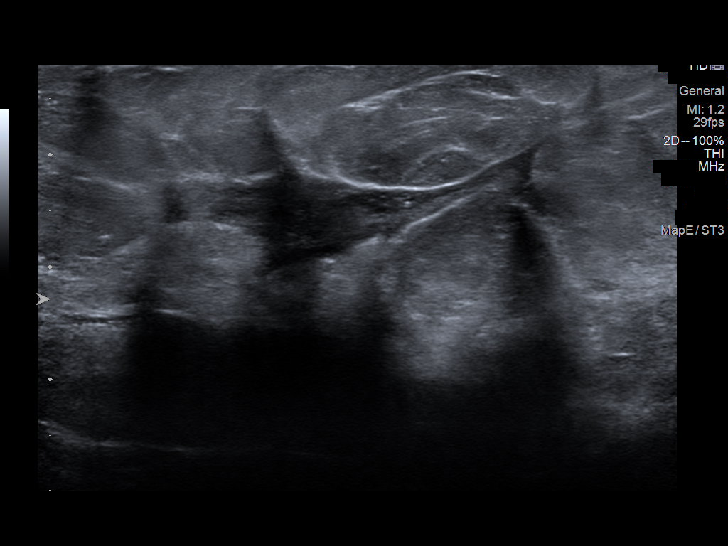
[im 10/15]
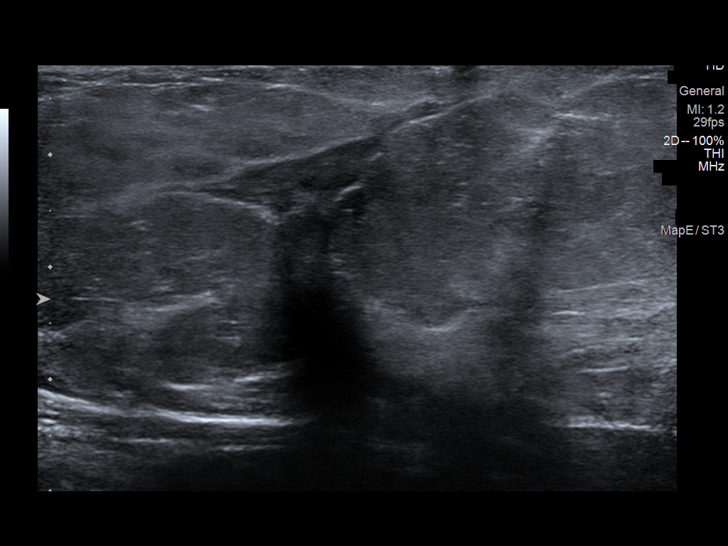
[im 11/15]
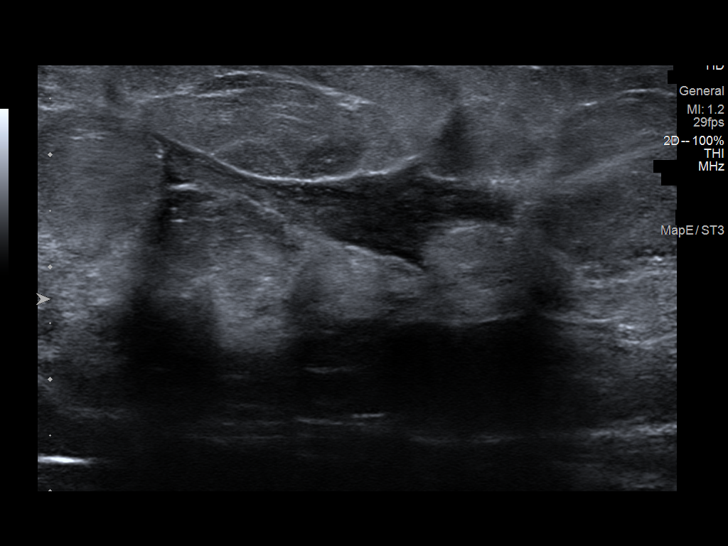
[im 13/15]
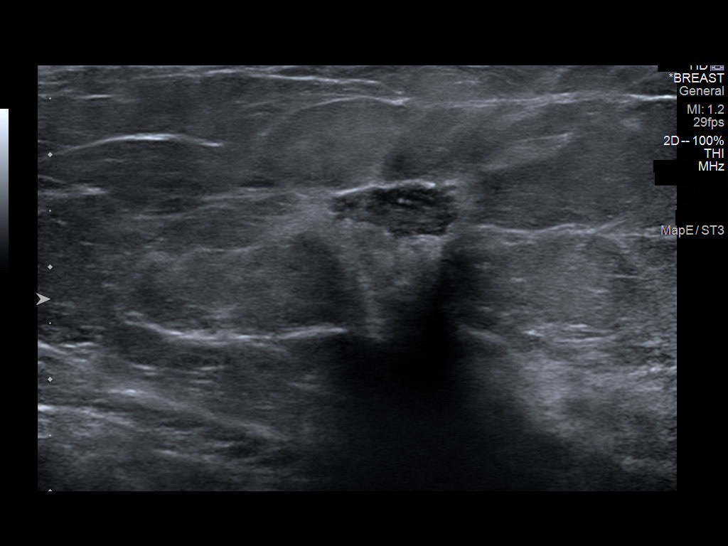
[im 14/15]
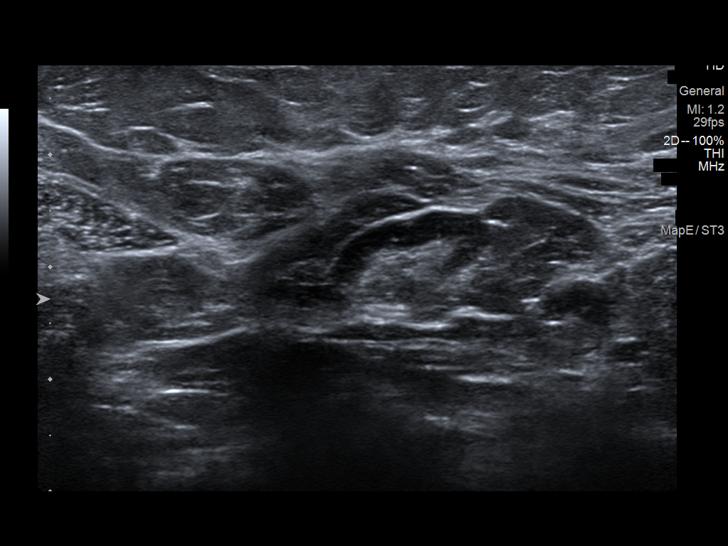
[im 15/15]
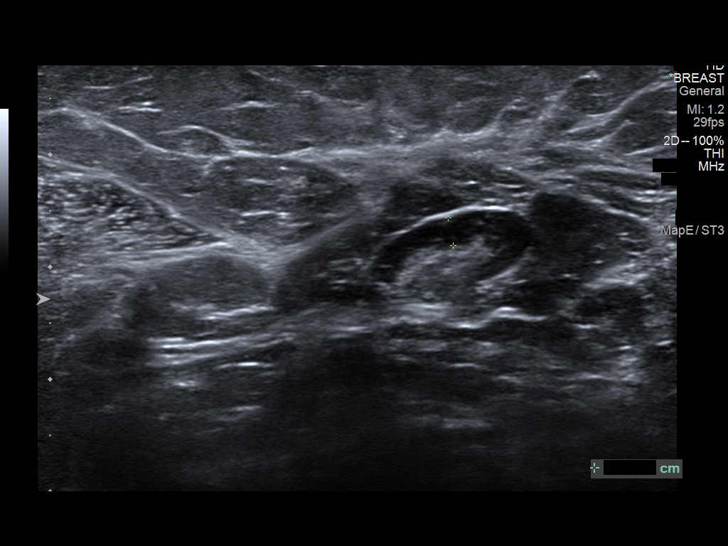

[13 of 15 positions shown; findings below may reference images not displayed]

On follow-up ultrasound performed 03/31/2021 the irregular
cavity/fluid collection was described as decreased in size. Two
persistent sinus tract to the skin were noted. One month follow up
was recommended at that time. Patient was seen today with an
interpreter.

EXAM:
ULTRASOUND OF THE LEFT BREAST
FINDINGS: On physical exam, there are 2 areas of erythema and scabbing in the
left breast at the 9-10 o'clock position.

Targeted ultrasound at the areas of erythema and scabbing, at the
[DATE] position 3 cm from the nipple, was performed. A [DATE] x 1.0 x
cm irregular hypoechoic mass with angular margins is noted, similar
to prior ultrasound 03/16/2021. Persistent sinus tract the skin is
noted, extending to the more inferior area of scabbing noted on
physical exam.

Targeted ultrasound of the left axilla was performed demonstrating
lymph nodes with normal morphology.
IMPRESSION: 1. A 3.2 cm irregular hypoechoic mass at the left breast [DATE]
position is not significantly changed compared to 03/16/2021. Given
the lack of resolution following antibiotic therapy as well as
persistent sinus tract to the skin, findings may represent
granulomatous mastitis.
2. No left axillary lymphadenopathy.

RECOMMENDATION:
Left breast ultrasound-guided biopsy. Given the prior concern for
infection, it may be helpful to send some samples for microbiology
in addition to surgical pathology. Additionally, if on the day of
procedure there are areas that appear amenable to aspiration,
ultrasound-guided aspiration could be performed in addition to
biopsy.

I have discussed the findings and recommendations with the patient.
The biopsy procedure was discussed with the patient and questions
were answered. Patient expressed their understanding of the biopsy
recommendation.

BI-RADS CATEGORY  4: Suspicious.
